# Patient Record
Sex: Female | Born: 1980
Health system: Southern US, Community
[De-identification: ages and names within clinical notes are randomized; demographics above are authoritative.]

## PROBLEM LIST (undated history)

## (undated) ENCOUNTER — Ambulatory Visit (HOSPITAL_COMMUNITY): Admission: EM | Payer: BC Managed Care – PPO | Source: Home / Self Care

## (undated) DIAGNOSIS — I10 Essential (primary) hypertension: Secondary | ICD-10-CM

## (undated) DIAGNOSIS — K219 Gastro-esophageal reflux disease without esophagitis: Secondary | ICD-10-CM

## (undated) DIAGNOSIS — D509 Iron deficiency anemia, unspecified: Secondary | ICD-10-CM

## (undated) DIAGNOSIS — J4530 Mild persistent asthma, uncomplicated: Secondary | ICD-10-CM

## (undated) DIAGNOSIS — Z8742 Personal history of other diseases of the female genital tract: Secondary | ICD-10-CM

## (undated) DIAGNOSIS — D252 Subserosal leiomyoma of uterus: Secondary | ICD-10-CM

## (undated) DIAGNOSIS — Z973 Presence of spectacles and contact lenses: Secondary | ICD-10-CM

## (undated) DIAGNOSIS — F419 Anxiety disorder, unspecified: Secondary | ICD-10-CM

## (undated) DIAGNOSIS — T7840XA Allergy, unspecified, initial encounter: Secondary | ICD-10-CM

## (undated) DIAGNOSIS — F32A Depression, unspecified: Secondary | ICD-10-CM

## (undated) DIAGNOSIS — J45909 Unspecified asthma, uncomplicated: Secondary | ICD-10-CM

## (undated) HISTORY — DX: Anxiety disorder, unspecified: F41.9

## (undated) HISTORY — DX: Depression, unspecified: F32.A

## (undated) HISTORY — DX: Iron deficiency anemia, unspecified: D50.9

## (undated) HISTORY — DX: Allergy, unspecified, initial encounter: T78.40XA

## (undated) HISTORY — DX: Gastro-esophageal reflux disease without esophagitis: K21.9

## (undated) HISTORY — DX: Essential (primary) hypertension: I10

## (undated) HISTORY — PX: HERNIA REPAIR: SHX51

---
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
## 2001-07-20 ENCOUNTER — Emergency Department (HOSPITAL_COMMUNITY): Admission: EM | Admit: 2001-07-20 | Discharge: 2001-07-21 | Payer: Self-pay | Admitting: *Deleted

## 2002-06-24 ENCOUNTER — Emergency Department (HOSPITAL_COMMUNITY): Admission: EM | Admit: 2002-06-24 | Discharge: 2002-06-24 | Payer: Self-pay | Admitting: *Deleted

## 2002-07-19 ENCOUNTER — Emergency Department (HOSPITAL_COMMUNITY): Admission: EM | Admit: 2002-07-19 | Discharge: 2002-07-19 | Payer: Self-pay | Admitting: Emergency Medicine

## 2003-10-03 ENCOUNTER — Emergency Department (HOSPITAL_COMMUNITY): Admission: EM | Admit: 2003-10-03 | Discharge: 2003-10-03 | Payer: Self-pay | Admitting: Emergency Medicine

## 2003-11-12 ENCOUNTER — Emergency Department (HOSPITAL_COMMUNITY): Admission: EM | Admit: 2003-11-12 | Discharge: 2003-11-12 | Payer: Self-pay | Admitting: Emergency Medicine

## 2004-08-11 ENCOUNTER — Emergency Department (HOSPITAL_COMMUNITY): Admission: EM | Admit: 2004-08-11 | Discharge: 2004-08-11 | Payer: Self-pay | Admitting: Emergency Medicine

## 2004-09-30 ENCOUNTER — Encounter: Admission: RE | Admit: 2004-09-30 | Discharge: 2004-10-15 | Payer: Self-pay | Admitting: Nurse Practitioner

## 2005-01-05 ENCOUNTER — Emergency Department (HOSPITAL_COMMUNITY): Admission: EM | Admit: 2005-01-05 | Discharge: 2005-01-05 | Payer: Self-pay | Admitting: Emergency Medicine

## 2005-09-13 ENCOUNTER — Emergency Department (HOSPITAL_COMMUNITY): Admission: EM | Admit: 2005-09-13 | Discharge: 2005-09-14 | Payer: Self-pay | Admitting: Emergency Medicine

## 2005-09-14 ENCOUNTER — Emergency Department (HOSPITAL_COMMUNITY): Admission: EM | Admit: 2005-09-14 | Discharge: 2005-09-14 | Payer: Self-pay | Admitting: Emergency Medicine

## 2006-11-07 ENCOUNTER — Emergency Department (HOSPITAL_COMMUNITY): Admission: EM | Admit: 2006-11-07 | Discharge: 2006-11-07 | Payer: Self-pay | Admitting: Emergency Medicine

## 2006-12-13 ENCOUNTER — Emergency Department (HOSPITAL_COMMUNITY): Admission: EM | Admit: 2006-12-13 | Discharge: 2006-12-13 | Payer: Self-pay | Admitting: Emergency Medicine

## 2008-07-20 ENCOUNTER — Emergency Department (HOSPITAL_COMMUNITY): Admission: EM | Admit: 2008-07-20 | Discharge: 2008-07-20 | Payer: Self-pay | Admitting: Emergency Medicine

## 2010-12-08 LAB — URINE MICROSCOPIC-ADD ON

## 2010-12-08 LAB — URINALYSIS, ROUTINE W REFLEX MICROSCOPIC
Glucose, UA: NEGATIVE
Ketones, ur: NEGATIVE
Protein, ur: NEGATIVE

## 2010-12-08 LAB — URINE CULTURE

## 2010-12-08 LAB — POCT PREGNANCY, URINE
Operator id: 146091
Preg Test, Ur: NEGATIVE

## 2012-11-08 ENCOUNTER — Emergency Department (HOSPITAL_COMMUNITY)
Admission: EM | Admit: 2012-11-08 | Discharge: 2012-11-08 | Disposition: A | Discharge status: Home / Self Care | Payer: Self-pay | Attending: Emergency Medicine | Admitting: Emergency Medicine

## 2012-11-08 ENCOUNTER — Emergency Department (HOSPITAL_COMMUNITY): Payer: Self-pay

## 2012-11-08 ENCOUNTER — Encounter (HOSPITAL_COMMUNITY): Payer: Self-pay | Admitting: Emergency Medicine

## 2012-11-08 DIAGNOSIS — R0781 Pleurodynia: Secondary | ICD-10-CM

## 2012-11-08 DIAGNOSIS — Y9389 Activity, other specified: Secondary | ICD-10-CM | POA: Insufficient documentation

## 2012-11-08 DIAGNOSIS — J45909 Unspecified asthma, uncomplicated: Secondary | ICD-10-CM | POA: Insufficient documentation

## 2012-11-08 DIAGNOSIS — M542 Cervicalgia: Secondary | ICD-10-CM

## 2012-11-08 DIAGNOSIS — Y92410 Unspecified street and highway as the place of occurrence of the external cause: Secondary | ICD-10-CM | POA: Insufficient documentation

## 2012-11-08 DIAGNOSIS — S298XXA Other specified injuries of thorax, initial encounter: Secondary | ICD-10-CM | POA: Insufficient documentation

## 2012-11-08 DIAGNOSIS — Z3202 Encounter for pregnancy test, result negative: Secondary | ICD-10-CM | POA: Insufficient documentation

## 2012-11-08 HISTORY — DX: Unspecified asthma, uncomplicated: J45.909

## 2012-11-08 MED ORDER — CYCLOBENZAPRINE HCL 10 MG PO TABS: 10.0000 mg | ORAL_TABLET | Freq: Two times a day (BID) | ORAL | Status: DC | PRN

## 2012-11-08 MED ORDER — ACETAMINOPHEN 500 MG PO TABS
1000.0000 mg | ORAL_TABLET | Freq: Once | ORAL | Status: AC
  Administered 2012-11-08: 1000 mg via ORAL
  Filled 2012-11-08: qty 2

## 2012-11-08 MED ORDER — IBUPROFEN 600 MG PO TABS: 600.0000 mg | ORAL_TABLET | Freq: Four times a day (QID) | ORAL | Status: DC | PRN

## 2012-11-08 NOTE — ED Notes (Addendum)
Pt undressed, in gown, on continuous pulse oximetry and blood pressure cuff; family at bedside 
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
## 2012-12-17 ENCOUNTER — Encounter: Payer: Self-pay | Admitting: Obstetrics and Gynecology

## 2012-12-17 ENCOUNTER — Ambulatory Visit (INDEPENDENT_AMBULATORY_CARE_PROVIDER_SITE_OTHER): Payer: Self-pay | Admitting: Obstetrics and Gynecology

## 2012-12-17 VITALS — BP 134/95 | HR 87 | Ht 63.0 in | Wt 127.3 lb

## 2012-12-17 DIAGNOSIS — Z113 Encounter for screening for infections with a predominantly sexual mode of transmission: Secondary | ICD-10-CM

## 2012-12-17 DIAGNOSIS — R1031 Right lower quadrant pain: Secondary | ICD-10-CM

## 2012-12-17 NOTE — Addendum Note (Signed)
Addended by: Franchot Mimes on: 12/17/2012 03:59 PM   Modules accepted: Orders

## 2012-12-17 NOTE — Progress Notes (Signed)
Patient reports right sided pelvic pain for the past 4-5 months. Also reports painful sexual intercourse, states it feels like her cervix hurts.

## 2012-12-17 NOTE — Progress Notes (Signed)
  Subjective:    Patient ID: Erika Frazier, female    DOB: 1980-10-11, 32 y.o.   MRN: [id number]  HPI 32 yo G0P0 presenting today for evaluation of RLQ pain. Patient states this pain is similar to the pain she experienced in 2007 when she was diagnosed with a right ovarian cyst. Patient was started on OCP and the pain resolved until 3 months ago. Patient self discontinued OCP 1 month ago and is not using contraception. She would welcome a pregnancy if it were to occur. Patient describes the pain as Kiwana Deblasi, throbbing, and non radiating. She denies any other associated symptoms. She denies any aggravating factors, Ibuprofen helps.   Past Medical History  Diagnosis Date  . Asthma    No past surgical history on file. No family history on file. History  Substance Use Topics  . Smoking status: Never Smoker   . Smokeless tobacco: Not on file  . Alcohol Use: Yes      Review of Systems  All other systems reviewed and are negative.       Objective:   Physical Exam  GENERAL: Well-developed, well-nourished female in no acute distress.  ABDOMEN: Soft, nontender, nondistended.  PELVIC: Normal external female genitalia. Vagina is pink and rugated. Thick white discharge. Normal appearing cervix. Uterus is normal in size. No adnexal mass or tenderness. EXTREMITIES: No cyanosis, clubbing, or edema, 2+ distal pulses.     Assessment & Plan:  32 yo G0 with RLQ pain - wet prep collected - Patient requested full STD screening (GC/Chl, Hep B, Hep C, HIV and RPR) - Pelvic ultrasound ordered - Patient will be contacted with any abnormal results - Upon further consideration, patient states that she will resume OCPs if ultrasound is normal - RTC for annual exam

## 2012-12-18 ENCOUNTER — Telehealth: Payer: Self-pay | Admitting: *Deleted

## 2012-12-18 ENCOUNTER — Other Ambulatory Visit: Payer: Self-pay | Admitting: Obstetrics and Gynecology

## 2012-12-18 LAB — RPR

## 2012-12-18 LAB — WET PREP, GENITAL: Yeast Wet Prep HPF POC: NONE SEEN

## 2012-12-18 LAB — HEPATITIS C ANTIBODY: HCV Ab: NEGATIVE

## 2012-12-18 LAB — GC/CHLAMYDIA PROBE AMP: CT Probe RNA: NEGATIVE

## 2012-12-18 LAB — HEPATITIS B SURFACE ANTIGEN: Hepatitis B Surface Ag: NEGATIVE

## 2012-12-18 MED ORDER — METRONIDAZOLE 500 MG PO TABS: 500.0000 mg | ORAL_TABLET | Freq: Two times a day (BID) | ORAL | Status: DC

## 2012-12-18 NOTE — Telephone Encounter (Addendum)
Message copied by Jill Side on Tue Dec 18, 2012 11:03 AM ------      Message from: Catalina Antigua      Created: Tue Dec 18, 2012 10:48 AM       Please inform patient of positive BV. Rx flagyl e-prescribed            Thanks            Peggy ------  Called pt and left message on her personal voice mail regarding test results and medication treatment ordered. She may call back if she has additional questions.

## 2012-12-20 ENCOUNTER — Ambulatory Visit (HOSPITAL_COMMUNITY)
Admission: RE | Admit: 2012-12-20 | Discharge: 2012-12-20 | Disposition: A | Discharge status: Home / Self Care | Payer: Self-pay | Source: Ambulatory Visit | Attending: Obstetrics and Gynecology | Admitting: Obstetrics and Gynecology

## 2012-12-20 DIAGNOSIS — D259 Leiomyoma of uterus, unspecified: Secondary | ICD-10-CM | POA: Insufficient documentation

## 2012-12-20 DIAGNOSIS — N83209 Unspecified ovarian cyst, unspecified side: Secondary | ICD-10-CM | POA: Insufficient documentation

## 2012-12-20 DIAGNOSIS — Z113 Encounter for screening for infections with a predominantly sexual mode of transmission: Secondary | ICD-10-CM

## 2012-12-20 DIAGNOSIS — R1031 Right lower quadrant pain: Secondary | ICD-10-CM | POA: Insufficient documentation

## 2012-12-24 ENCOUNTER — Telehealth: Payer: Self-pay | Admitting: General Practice

## 2012-12-24 DIAGNOSIS — N83209 Unspecified ovarian cyst, unspecified side: Secondary | ICD-10-CM

## 2012-12-24 NOTE — Telephone Encounter (Signed)
Patient called and left message stating she would like her test results and ultrasound results. Called patient, no answer- left message stating we are returning your phone call, please call us back at the clinics and let us know if we can leave detailed information on your voicemail.

## 2012-12-24 NOTE — Telephone Encounter (Signed)
Message copied by Mannie Stabile on Mon Dec 24, 2012  4:46 PM ------      Message from: Montebello, Gigi Gin      Created: Fri Dec 21, 2012 12:05 PM       Please inform patient of normal ultrasound with the presence of a 3 cm cyst on her right ovary. She will be scheduled for a repeat ultrasound in December to confirm resolution of this cyst. In the mean time she should restart birth control RX. Please schedule repeat ultrasound for mid December ------

## 2012-12-26 NOTE — Telephone Encounter (Signed)
Ultrasound appt made for 12/17 at 8:30. Called patient and informed her of ultrasound results and need for follow up ultrasound in December to make sure the cyst has resolved and notified the patient of 12/17 appt. Patient verbalized understanding. Also informed patient that her doctor recommend she restart her birth control. Patient verbalized understanding and had no further questions

## 2012-12-26 NOTE — Telephone Encounter (Signed)
Pt left new message today @ 1124 stating that she is returning a call and it is ok to leave a message of her results.

## 2013-02-13 ENCOUNTER — Ambulatory Visit (HOSPITAL_COMMUNITY): Payer: Self-pay | Attending: Obstetrics and Gynecology

## 2013-05-13 DIAGNOSIS — D509 Iron deficiency anemia, unspecified: Secondary | ICD-10-CM | POA: Insufficient documentation

## 2014-03-04 ENCOUNTER — Ambulatory Visit: Payer: Self-pay | Admitting: Physical Therapy

## 2014-03-17 ENCOUNTER — Ambulatory Visit: Payer: Self-pay | Attending: Obstetrics and Gynecology | Admitting: Physical Therapy

## 2018-10-03 DIAGNOSIS — H6121 Impacted cerumen, right ear: Secondary | ICD-10-CM | POA: Diagnosis not present

## 2018-10-03 DIAGNOSIS — Z113 Encounter for screening for infections with a predominantly sexual mode of transmission: Secondary | ICD-10-CM | POA: Diagnosis not present

## 2018-10-03 DIAGNOSIS — Z0000 Encounter for general adult medical examination without abnormal findings: Secondary | ICD-10-CM | POA: Diagnosis not present

## 2018-10-03 DIAGNOSIS — Z87891 Personal history of nicotine dependence: Secondary | ICD-10-CM | POA: Diagnosis not present

## 2018-10-03 DIAGNOSIS — D509 Iron deficiency anemia, unspecified: Secondary | ICD-10-CM | POA: Diagnosis not present

## 2019-02-15 DIAGNOSIS — R399 Unspecified symptoms and signs involving the genitourinary system: Secondary | ICD-10-CM | POA: Diagnosis not present

## 2019-02-16 DIAGNOSIS — Z20828 Contact with and (suspected) exposure to other viral communicable diseases: Secondary | ICD-10-CM | POA: Diagnosis not present

## 2019-03-08 DIAGNOSIS — Z20828 Contact with and (suspected) exposure to other viral communicable diseases: Secondary | ICD-10-CM | POA: Diagnosis not present

## 2019-03-26 DIAGNOSIS — J4530 Mild persistent asthma, uncomplicated: Secondary | ICD-10-CM | POA: Insufficient documentation

## 2019-03-26 DIAGNOSIS — N760 Acute vaginitis: Secondary | ICD-10-CM | POA: Diagnosis not present

## 2019-03-26 DIAGNOSIS — R3129 Other microscopic hematuria: Secondary | ICD-10-CM | POA: Diagnosis not present

## 2019-04-01 DIAGNOSIS — R3129 Other microscopic hematuria: Secondary | ICD-10-CM | POA: Diagnosis not present

## 2019-06-18 DIAGNOSIS — R3129 Other microscopic hematuria: Secondary | ICD-10-CM | POA: Diagnosis not present

## 2019-10-04 DIAGNOSIS — Z13220 Encounter for screening for lipoid disorders: Secondary | ICD-10-CM | POA: Diagnosis not present

## 2019-10-04 DIAGNOSIS — F4321 Adjustment disorder with depressed mood: Secondary | ICD-10-CM | POA: Insufficient documentation

## 2019-10-04 DIAGNOSIS — R102 Pelvic and perineal pain: Secondary | ICD-10-CM | POA: Diagnosis not present

## 2019-10-04 DIAGNOSIS — R630 Anorexia: Secondary | ICD-10-CM | POA: Diagnosis not present

## 2019-10-04 DIAGNOSIS — Z1329 Encounter for screening for other suspected endocrine disorder: Secondary | ICD-10-CM | POA: Diagnosis not present

## 2019-10-04 DIAGNOSIS — Z0000 Encounter for general adult medical examination without abnormal findings: Secondary | ICD-10-CM | POA: Diagnosis not present

## 2019-11-15 DIAGNOSIS — Z20828 Contact with and (suspected) exposure to other viral communicable diseases: Secondary | ICD-10-CM | POA: Diagnosis not present

## 2019-12-19 DIAGNOSIS — D252 Subserosal leiomyoma of uterus: Secondary | ICD-10-CM | POA: Diagnosis not present

## 2019-12-19 DIAGNOSIS — R102 Pelvic and perineal pain: Secondary | ICD-10-CM | POA: Diagnosis not present

## 2019-12-20 DIAGNOSIS — D252 Subserosal leiomyoma of uterus: Secondary | ICD-10-CM | POA: Diagnosis not present

## 2020-01-07 DIAGNOSIS — K219 Gastro-esophageal reflux disease without esophagitis: Secondary | ICD-10-CM | POA: Diagnosis not present

## 2020-01-07 DIAGNOSIS — Z6823 Body mass index (BMI) 23.0-23.9, adult: Secondary | ICD-10-CM | POA: Diagnosis not present

## 2020-01-07 DIAGNOSIS — R1310 Dysphagia, unspecified: Secondary | ICD-10-CM | POA: Diagnosis not present

## 2020-01-07 DIAGNOSIS — R634 Abnormal weight loss: Secondary | ICD-10-CM | POA: Diagnosis not present

## 2020-01-07 DIAGNOSIS — D509 Iron deficiency anemia, unspecified: Secondary | ICD-10-CM | POA: Diagnosis not present

## 2020-01-07 DIAGNOSIS — D649 Anemia, unspecified: Secondary | ICD-10-CM | POA: Diagnosis not present

## 2020-01-07 HISTORY — PX: ESOPHAGOGASTRODUODENOSCOPY: SHX1529

## 2020-02-12 ENCOUNTER — Encounter: Payer: Self-pay | Admitting: Physician Assistant

## 2020-02-12 ENCOUNTER — Ambulatory Visit (INDEPENDENT_AMBULATORY_CARE_PROVIDER_SITE_OTHER): Payer: BC Managed Care – PPO | Admitting: Physician Assistant

## 2020-02-12 VITALS — BP 120/82 | HR 77 | Temp 97.8°F | Ht 62.25 in | Wt 136.0 lb

## 2020-02-12 DIAGNOSIS — F4321 Adjustment disorder with depressed mood: Secondary | ICD-10-CM

## 2020-02-12 DIAGNOSIS — D252 Subserosal leiomyoma of uterus: Secondary | ICD-10-CM | POA: Diagnosis not present

## 2020-02-12 DIAGNOSIS — K219 Gastro-esophageal reflux disease without esophagitis: Secondary | ICD-10-CM | POA: Diagnosis not present

## 2020-02-12 DIAGNOSIS — Z23 Encounter for immunization: Secondary | ICD-10-CM | POA: Diagnosis not present

## 2020-02-12 DIAGNOSIS — D509 Iron deficiency anemia, unspecified: Secondary | ICD-10-CM

## 2020-02-12 DIAGNOSIS — N946 Dysmenorrhea, unspecified: Secondary | ICD-10-CM

## 2020-02-12 NOTE — Patient Instructions (Signed)
It was great to see you!  Referral to gynecology and gastroenterology.  Update blood work today.  Follow-up as needed, or in late August 2022 for physical.  Take care,  Inda Coke PA-C

## 2020-02-12 NOTE — Progress Notes (Signed)
Erika Frazier is a 39 y.o. female is here to establish care.  I acted as a Education administrator for [employer], PA-C Anselmo Pickler, LPN   History of Present Illness:   Chief Complaint  Patient presents with  . Establish Care  . Gastroesophageal Reflux  . Fibroids    HPI   Pt is here to establish care today.  GERD Pt had EGD done on Nov 9th and was dx with GERD. She was having some dysphagia, intentional weight loss and anemia. Had her esophagus stretched. Pt was started on Protonix 40 mg daily. Tolerating medication. Pt is still having heartburn and throat sometimes feels like she still has some ongoing swallowing issues. Symptoms are overall improved however. She was also recommended to get a colonoscopy to evaluate her iron deficiency but she was reluctant to do this.  Fibroids Pt has been having painful periods worse the past 6 months. Pt does not have a GYN. Would like to see a gynecologist. Had a pelvic u/s in October 2021 which showed: Two subserosal uterine leiomyomata. She has never seen a gynecologist and would like to.  Macrocytic anemia Currently taking an every other day oral liquid iron supplement. She does not endorse heavy periods. Has history of fatigue with low iron, no prior transfusion. Denies rectal bleeding or personal/family history of colon cancer.  Depression/Insomnia Taking Remeron 15 mg daily. She feels well on this medication. Was on Wellbutrin but thinks that this may have been blunting her appetite. Denies SI/HI. Symptoms most significant after her mother passed away.   Health Maintenance Due  Topic Date Due  . TETANUS/TDAP  Never done  . PAP SMEAR-Modifier  Never done    Past Medical History:  Diagnosis Date  . GERD (gastroesophageal reflux disease)      Social History   Tobacco Use  . Smoking status: Light Tobacco Smoker    Packs/day: 0.00    Years: 1.00    Pack years: 0.00    Types: Cigars  . Smokeless tobacco: Never Used  Vaping Use   . Vaping Use: Never used  Substance Use Topics  . Alcohol use: Yes    Alcohol/week: 0.0 standard drinks  . Drug use: Yes    Types: Marijuana    Comment: weekends    History reviewed. No pertinent surgical history.  Family History  Problem Relation Age of Onset  . Hypertension Mother   . Alcohol abuse Father        now in remission  . Lupus Maternal Aunt   . Heart disease Maternal Aunt     PMHx, SurgHx, SocialHx, FamHx, Medications, and Allergies were reviewed in the Visit Navigator and updated as appropriate.   Patient Active Problem List   Diagnosis Date Noted  . Fibroids, subserous 02/12/2020  . Adjustment disorder with depressed mood 10/04/2019  . Mild persistent asthma without complication 33/38/3291  . Iron deficiency anemia 05/13/2013    Social History   Tobacco Use  . Smoking status: Light Tobacco Smoker    Packs/day: 0.00    Years: 1.00    Pack years: 0.00    Types: Cigars  . Smokeless tobacco: Never Used  Vaping Use  . Vaping Use: Never used  Substance Use Topics  . Alcohol use: Yes    Alcohol/week: 0.0 standard drinks  . Drug use: Yes    Types: Marijuana    Comment: weekends    Current Medications and Allergies:    Current Outpatient Medications:  .  albuterol (VENTOLIN HFA)  108 (90 Base) MCG/ACT inhaler, Inhale 2 puffs into the lungs every 6 (six) hours as needed., Disp: , Rfl:  .  Ferrous Fumarate (IRON) 18 MG TBCR, Take 18 mg by mouth every other day., Disp: , Rfl:  .  mirtazapine (REMERON) 15 MG tablet, Take by mouth., Disp: , Rfl:  .  pantoprazole (PROTONIX) 40 MG tablet, Take 40 mg by mouth daily., Disp: , Rfl:   No Known Allergies  Review of Systems   ROS  Negative unless otherwise specified per HPI.  Vitals:   Vitals:   02/12/20 1315  BP: 120/82  Pulse: 77  Temp: 97.8 F (36.6 C)  TempSrc: Temporal  SpO2: 99%  Weight: 136 lb (61.7 kg)  Height: 5' 2.25" (1.581 m)     Body mass index is 24.68 kg/m.   Physical Exam:     Physical Exam Vitals and nursing note reviewed.  Constitutional:      General: She is not in acute distress.    Appearance: She is well-developed. She is not ill-appearing, toxic-appearing or sickly-appearing.  Cardiovascular:     Rate and Rhythm: Normal rate and regular rhythm.     Pulses: Normal pulses.     Heart sounds: Normal heart sounds, S1 normal and S2 normal.     Comments: No LE edema Pulmonary:     Effort: Pulmonary effort is normal.     Breath sounds: Normal breath sounds.  Skin:    General: Skin is warm, dry and intact.  Neurological:     Mental Status: She is alert.     GCS: GCS eye subscore is 4. GCS verbal subscore is 5. GCS motor subscore is 6.  Psychiatric:        Mood and Affect: Mood and affect normal.        Speech: Speech normal.        Behavior: Behavior normal. Behavior is cooperative.      Assessment and Plan:    Deepti was seen today for establish care, gastroesophageal reflux and fibroids.  Diagnoses and all orders for this visit:  Iron deficiency anemia, unspecified iron deficiency anemia type Update blood work today to determine degree of anemia. Encouraged colonoscopy; she would like to start with new GI group; referral placed. Will advise on supplementation based on results. -     CBC with Differential/Platelet; Future -     Comprehensive metabolic panel; Future -     Ferritin; Future -     Iron; Future -     B12 and Folate Panel; Future -     Iron -     B12 and Folate Panel -     Ferritin -     Comprehensive metabolic panel -     CBC with Differential/Platelet -     Ambulatory referral to Gastroenterology  Gastroesophageal reflux disease, unspecified whether esophagitis present Overall improved, continue protonix 40 mg daily. -     Ambulatory referral to Gastroenterology  Fibroids, subserous; Dysmenorrhea No red flags; referral to ob-gyn. -     Ambulatory referral to Obstetrics / Gynecology  Adjustment disorder with  depressed mood Well controlled. Continue remeron 15 mg daily. Follow-up as needed.  Need for immunization against influenza -     Flu Vaccine QUAD 36+ mos IM  CMA or LPN served as scribe during this visit. History, Physical, and Plan performed by medical provider. The above documentation has been reviewed and is accurate and complete.  Inda Coke, PA-C [place], [facility] 02/12/2020  Follow-up: No follow-ups on file.

## 2020-02-13 LAB — CBC WITH DIFFERENTIAL/PLATELET
Absolute Monocytes: 389 cells/uL (ref 200–950)
Basophils Absolute: 32 cells/uL (ref 0–200)
Basophils Relative: 0.6 %
Eosinophils Absolute: 130 cells/uL (ref 15–500)
Eosinophils Relative: 2.4 %
HCT: 35.3 % (ref 35.0–45.0)
Hemoglobin: 10.9 g/dL — ABNORMAL LOW (ref 11.7–15.5)
Lymphs Abs: 1723 cells/uL (ref 850–3900)
MCH: 23.6 pg — ABNORMAL LOW (ref 27.0–33.0)
MCHC: 30.9 g/dL — ABNORMAL LOW (ref 32.0–36.0)
MCV: 76.6 fL — ABNORMAL LOW (ref 80.0–100.0)
MPV: 9.8 fL (ref 7.5–12.5)
Monocytes Relative: 7.2 %
Neutro Abs: 3127 cells/uL (ref 1500–7800)
Neutrophils Relative %: 57.9 %
Platelets: 409 10*3/uL — ABNORMAL HIGH (ref 140–400)
RBC: 4.61 10*6/uL (ref 3.80–5.10)
RDW: 14.1 % (ref 11.0–15.0)
Total Lymphocyte: 31.9 %
WBC: 5.4 10*3/uL (ref 3.8–10.8)

## 2020-02-13 LAB — COMPREHENSIVE METABOLIC PANEL
AG Ratio: 2.1 (calc) (ref 1.0–2.5)
ALT: 10 U/L (ref 6–29)
AST: 14 U/L (ref 10–30)
Albumin: 4.4 g/dL (ref 3.6–5.1)
Alkaline phosphatase (APISO): 58 U/L (ref 31–125)
BUN: 17 mg/dL (ref 7–25)
CO2: 27 mmol/L (ref 20–32)
Calcium: 9.7 mg/dL (ref 8.6–10.2)
Chloride: 104 mmol/L (ref 98–110)
Creat: 0.8 mg/dL (ref 0.50–1.10)
Globulin: 2.1 g/dL (calc) (ref 1.9–3.7)
Glucose, Bld: 84 mg/dL (ref 65–99)
Potassium: 4.5 mmol/L (ref 3.5–5.3)
Sodium: 138 mmol/L (ref 135–146)
Total Bilirubin: 0.4 mg/dL (ref 0.2–1.2)
Total Protein: 6.5 g/dL (ref 6.1–8.1)

## 2020-02-13 LAB — B12 AND FOLATE PANEL
Folate: 12.4 ng/mL
Vitamin B-12: 409 pg/mL (ref 200–1100)

## 2020-02-13 LAB — FERRITIN: Ferritin: 26 ng/mL (ref 16–154)

## 2020-02-13 LAB — IRON: Iron: 85 ug/dL (ref 40–190)

## 2020-02-27 ENCOUNTER — Encounter: Payer: Self-pay | Admitting: Physician Assistant

## 2020-02-27 DIAGNOSIS — L709 Acne, unspecified: Secondary | ICD-10-CM

## 2020-03-18 ENCOUNTER — Encounter: Payer: Self-pay | Admitting: Obstetrics & Gynecology

## 2020-03-18 ENCOUNTER — Other Ambulatory Visit (HOSPITAL_COMMUNITY)
Admission: RE | Admit: 2020-03-18 | Discharge: 2020-03-18 | Disposition: A | Discharge status: Home / Self Care | Payer: BC Managed Care – PPO | Source: Ambulatory Visit | Attending: Obstetrics & Gynecology | Admitting: Obstetrics & Gynecology

## 2020-03-18 ENCOUNTER — Other Ambulatory Visit: Payer: Self-pay

## 2020-03-18 ENCOUNTER — Ambulatory Visit (INDEPENDENT_AMBULATORY_CARE_PROVIDER_SITE_OTHER): Payer: BC Managed Care – PPO | Admitting: Obstetrics & Gynecology

## 2020-03-18 VITALS — BP 146/91 | HR 71 | Wt 140.0 lb

## 2020-03-18 DIAGNOSIS — R102 Pelvic and perineal pain: Secondary | ICD-10-CM | POA: Diagnosis not present

## 2020-03-18 DIAGNOSIS — Z113 Encounter for screening for infections with a predominantly sexual mode of transmission: Secondary | ICD-10-CM | POA: Diagnosis not present

## 2020-03-18 DIAGNOSIS — Z124 Encounter for screening for malignant neoplasm of cervix: Secondary | ICD-10-CM | POA: Insufficient documentation

## 2020-03-18 DIAGNOSIS — D252 Subserosal leiomyoma of uterus: Secondary | ICD-10-CM

## 2020-03-18 DIAGNOSIS — D509 Iron deficiency anemia, unspecified: Secondary | ICD-10-CM | POA: Diagnosis not present

## 2020-03-18 NOTE — Progress Notes (Signed)
40 y.o. Erika Frazier here for complaint of pelvic pain and pain with her menstrual cycles.  Cycles are regular.  Flow lasts about 5 days.  Uses regular tampons with mini-pad and changes every 4-6 hours.  She does have a hx of iron deficiency and she takes liquid iron.  She's never had an iron infusion.  Had endoscopy 01/07/2020 with esophageal stretching with Dr. Rolm Bookbinder with [facility].  Had ultrasound with [place] [facility] facility on 12/20/2019.  Findings showed:  8.6 x 1.8 x 5.1 cm = volume: 41 mL. Large fibroid at posterior upper uterus 5.1 x 3.9 x 3.9 cm. Smaller leiomyoma at fundus 3.5 x 2.3 x 3.6 cm. Both of these are subserosal. No additional uterine masses.  Endometrium thickness: 5 mm. No endometrial fluid or focal abnormality.  Ovaries normal.  No free fluid.  Pt feels like her pain has gradually worsened over the past few years.  She did not know she had fibroids.  Pain for her is present around mid cycle and then again right before cycle and on her cycle.  She would really like this to improve.  Patient's last menstrual period was 03/10/2020.          Sexually active: Yes.    The current method of family planning is condoms  Smoker:  no  Health Maintenance: Pap:  Not recently History of abnormal Pap:  no MMG:  Guidelines reviewed Colonoscopy:  Planning due to anemia   reports that she has quit smoking. Her smoking use included cigars. She smoked 0.00 packs per day for 1.00 year. She has never used smokeless tobacco. She reports current alcohol use. She reports current drug use. Drug: Marijuana.  Past Medical History:  Diagnosis Date  . GERD (gastroesophageal reflux disease)     History reviewed. No pertinent surgical history.  Current Outpatient Medications  Medication Sig Dispense Refill  . albuterol (VENTOLIN HFA) 108 (90 Base) MCG/ACT inhaler Inhale 2 puffs into the lungs every 6 (six) hours as needed.    . Ferrous Fumarate (IRON) 18 MG TBCR Take 18 mg  by mouth every other day.    . mirtazapine (REMERON) 15 MG tablet Take by mouth.    . pantoprazole (PROTONIX) 40 MG tablet Take 40 mg by mouth daily.     No current facility-administered medications for this visit.    Family History  Problem Relation Age of Onset  . Hypertension Mother   . Alcohol abuse Father        now in remission  . Lupus Maternal Aunt   . Heart disease Maternal Aunt     Review of Systems  All other systems reviewed and are negative.   Exam:   BP (!) 146/91   Pulse 71   Wt 140 lb (63.5 kg)   LMP 03/10/2020   BMI 25.40 kg/m      General appearance: alert, cooperative and appears stated age Head: Normocephalic, without obvious abnormality, atraumatic Neck: no adenopathy, supple, symmetrical, trachea midline and thyroid normal to inspection and palpation Lungs: clear to auscultation bilaterally Breasts: normal appearance, no masses or tenderness Heart: regular rate and rhythm Abdomen: soft, non-tender; bowel sounds normal; no masses,  no organomegaly Extremities: extremities normal, atraumatic, no cyanosis or edema Skin: Skin color, texture, turgor normal. No rashes or lesions Lymph nodes: Cervical, supraclavicular, and axillary nodes normal. No abnormal inguinal nodes palpated Neurologic: Grossly normal   Pelvic: External genitalia:  no lesions  Urethra:  normal appearing urethra with no masses, tenderness or lesions              Bartholins and Skenes: normal                 Vagina: normal appearing vagina with normal color and discharge, no lesions              Cervix: no lesions              Pap taken: Yes.   Bimanual Exam:  Uterus:  enlarged, 10 weeks size, mobile              Adnexa: normal adnexa and no mass, fullness, tenderness              Anus:  No lesions  Chaperone, Kathrene Alu, RN, was present for exam.  Assessment/Plan: 1. Fibroids, subserous - treatment options discussed including [place], RFA treatment, myomectomy,  hysterectomy.  Pt would like to keep uterus.  Is doubtful of ability to get pregnant but doesn't want to fully eliminate this.  We discussed that [place] and, of course hysterectomy are not possible with future desires for pregnancy.  Also discussed that after RFA treatment, pregnancy not currently recommended but this is being investigated at this time.  She is most interested in RFA treatment.  [place] website shown to pt for additional information.  We also discussed considering laparoscopy at the same time for evaluation for endometriosis.  Possible findings and treatment discussed.  2. Pelvic pain - see above  3. Iron deficiency anemia, unspecified iron deficiency anemia type - followed by PCP  4. Cervical cancer screening - Cytology - PAP( )  5. Screening examination for STD (sexually transmitted disease) - GC/CHl/trich obtained with pap smear - RPR+HBsAg+HIV - Hepatitis C antibody  39 minutes of total time was spent for this patient encounter, including preparation, face-to-face counseling with the patient and coordination of care, and documentation of the encounter.'

## 2020-03-19 ENCOUNTER — Encounter: Payer: Self-pay | Admitting: Obstetrics & Gynecology

## 2020-03-19 LAB — RPR+HBSAG+HIV
HIV Screen 4th Generation wRfx: NONREACTIVE
Hepatitis B Surface Ag: NEGATIVE
RPR Ser Ql: NONREACTIVE

## 2020-03-19 LAB — CYTOLOGY - PAP
Chlamydia: NEGATIVE
Comment: NEGATIVE
Comment: NEGATIVE
Comment: NEGATIVE
Comment: NORMAL
Diagnosis: NEGATIVE
High risk HPV: NEGATIVE
Neisseria Gonorrhea: NEGATIVE
Trichomonas: NEGATIVE

## 2020-03-19 LAB — HEPATITIS C ANTIBODY: Hep C Virus Ab: <0.1 s/co ratio (ref 0.0–0.9)

## 2020-03-27 ENCOUNTER — Other Ambulatory Visit (HOSPITAL_COMMUNITY)
Admission: RE | Admit: 2020-03-27 | Discharge: 2020-03-27 | Disposition: A | Discharge status: Home / Self Care | Payer: BC Managed Care – PPO | Source: Ambulatory Visit | Attending: Family Medicine | Admitting: Family Medicine

## 2020-03-27 ENCOUNTER — Other Ambulatory Visit: Payer: Self-pay

## 2020-03-27 ENCOUNTER — Ambulatory Visit: Payer: BC Managed Care – PPO

## 2020-03-27 VITALS — BP 119/79 | HR 77

## 2020-03-27 DIAGNOSIS — N898 Other specified noninflammatory disorders of vagina: Secondary | ICD-10-CM | POA: Insufficient documentation

## 2020-03-27 NOTE — Progress Notes (Signed)
Patient states she has had vaginal discharge for a few days. Patient has not used any over the counter treatments. Patient describes a white discharge with no itching/burning. Patient denies any new sexual partners and would like STD screening done on todays culture.  Kathrene Alu RN

## 2020-03-30 LAB — CERVICOVAGINAL ANCILLARY ONLY
Bacterial Vaginitis (gardnerella): POSITIVE — AB
Candida Glabrata: NEGATIVE
Candida Vaginitis: NEGATIVE
Chlamydia: NEGATIVE
Comment: NEGATIVE
Comment: NEGATIVE
Comment: NEGATIVE
Comment: NEGATIVE
Comment: NEGATIVE
Comment: NORMAL
Neisseria Gonorrhea: NEGATIVE
Trichomonas: NEGATIVE

## 2020-03-31 ENCOUNTER — Encounter: Payer: Self-pay | Admitting: Physician Assistant

## 2020-03-31 DIAGNOSIS — Z20822 Contact with and (suspected) exposure to covid-19: Secondary | ICD-10-CM | POA: Diagnosis not present

## 2020-03-31 MED ORDER — METRONIDAZOLE 500 MG PO TABS: 500.0000 mg | ORAL_TABLET | Freq: Two times a day (BID) | ORAL | 0 refills | Status: DC

## 2020-03-31 NOTE — Addendum Note (Signed)
Addended by: Truett Mainland on: 03/31/2020 01:03 PM   Modules accepted: Orders

## 2020-06-29 NOTE — Telephone Encounter (Signed)
Called patient and left message to call the office back to set up a office visit or my chart visit with  Dr.Miller .

## 2020-07-01 ENCOUNTER — Other Ambulatory Visit: Payer: Self-pay

## 2020-07-01 ENCOUNTER — Telehealth (INDEPENDENT_AMBULATORY_CARE_PROVIDER_SITE_OTHER): Payer: BC Managed Care – PPO | Admitting: Obstetrics & Gynecology

## 2020-07-01 DIAGNOSIS — D509 Iron deficiency anemia, unspecified: Secondary | ICD-10-CM | POA: Diagnosis not present

## 2020-07-01 DIAGNOSIS — D252 Subserosal leiomyoma of uterus: Secondary | ICD-10-CM

## 2020-07-01 DIAGNOSIS — D500 Iron deficiency anemia secondary to blood loss (chronic): Secondary | ICD-10-CM | POA: Diagnosis not present

## 2020-07-01 DIAGNOSIS — R102 Pelvic and perineal pain: Secondary | ICD-10-CM

## 2020-07-01 NOTE — Progress Notes (Signed)
Virtual Visit via Video Note  I connected with Erika Frazier on 07/01/20 at  4:15 PM EDT by a video enabled telemedicine application and verified that I am speaking with the correct person using two identifiers.  Location: Patient: home Provider: office   I discussed the limitations of evaluation and management by telemedicine and the availability of in person appointments. The patient expressed understanding and agreed to proceed.  History of Present Illness: 40 yo G0 S AAF with symptomatic uterine fibroids who is desirous of treatment.  Last ultrasound 12/20/2019 at [facility] and in [place] showing fibroids 5.1 x 3.9 x 3.9cm and 3.5 x 2.3 x 2.6cm.  These are both subserosal.  Has some symptoms of enlarged uterus including pelvic pain and pressure as well as dysmenorrhea.  She does have a hx of iron deficiency and takes liquid iron to help.  Most recent CBC, 02/12/2020 showing mild anemia with hb 10.9.  Is desirous of treatment.  I saw her in January and we discussed myomectomy vs RFA treatment.  Pt desires to maintain uterus.  She continues to hope she will be able to have a pregnancy.  Is dating someone but not actively trying for pregnancy.  Wonders if she can even get pregnant as she has not in the past.  Aware that at this time RFA not approved for women who desire future pregnancy but also aware there are patients who have undergone RFA and have carried successful pregnancy.  Uterine rupture with risks to baby and to her discussed.  RFA of fibroids is what she wants, esp from a recovery standpoint.  Aware will need to get this approved with insurance and that we will need to also have a pre-operative ultrasound to re visualize location of fibroids and size.  Procedure, hospital stay, pain management, recovery, return to work all discussed.   Observations/Objective: WNWD AAF, NAD  Assessment and Plan: 1. Fibroids, subserous - will proceed with prior authorization with insurance and with  surgical planning  2. Pelvic pain  3. Iron deficiency anemia, unspecified iron deficiency anemia type    I discussed the assessment and treatment plan with the patient. The patient was provided an opportunity to ask questions and all were answered. The patient agreed with the plan and demonstrated an understanding of the instructions.   The patient was advised to call back or seek an in-person evaluation if the symptoms worsen or if the condition fails to improve as anticipated.  I provided 24 minutes of non-face-to-face time during this encounter.   Megan Salon, MD

## 2020-07-07 ENCOUNTER — Encounter (HOSPITAL_BASED_OUTPATIENT_CLINIC_OR_DEPARTMENT_OTHER): Payer: Self-pay | Admitting: Obstetrics & Gynecology

## 2020-07-08 ENCOUNTER — Telehealth: Payer: Self-pay | Admitting: *Deleted

## 2020-07-08 ENCOUNTER — Encounter: Payer: Self-pay | Admitting: *Deleted

## 2020-07-08 NOTE — Telephone Encounter (Signed)
Return call from patient. Reviewed process for precert of [place]. Patient will come to [place] for [employer] after work today to review consents.  Request address be sent through [facility] Chart message.

## 2020-07-08 NOTE — Telephone Encounter (Signed)
Patient into office to review [place] forms. ROI completed by patient. Denies questions.

## 2020-07-08 NOTE — Telephone Encounter (Signed)
Call to patient to review procedure scheduling. Left message to call back to [phone].

## 2020-07-10 NOTE — Telephone Encounter (Signed)
Precert document sent to [employer] ( scanned into chart). Will receive update in about15 days.   Encounter closed.

## 2020-07-20 ENCOUNTER — Encounter: Payer: BC Managed Care – PPO | Admitting: Physician Assistant

## 2020-07-22 ENCOUNTER — Ambulatory Visit: Payer: BC Managed Care – PPO | Admitting: Dermatology

## 2020-08-04 ENCOUNTER — Ambulatory Visit (INDEPENDENT_AMBULATORY_CARE_PROVIDER_SITE_OTHER): Payer: BC Managed Care – PPO | Admitting: Family

## 2020-08-04 ENCOUNTER — Encounter: Payer: Self-pay | Admitting: Family

## 2020-08-04 ENCOUNTER — Other Ambulatory Visit: Payer: Self-pay

## 2020-08-04 VITALS — BP 127/87 | HR 72 | Temp 98.4°F | Ht 62.25 in | Wt 144.0 lb

## 2020-08-04 DIAGNOSIS — K219 Gastro-esophageal reflux disease without esophagitis: Secondary | ICD-10-CM | POA: Diagnosis not present

## 2020-08-04 DIAGNOSIS — Z0000 Encounter for general adult medical examination without abnormal findings: Secondary | ICD-10-CM | POA: Diagnosis not present

## 2020-08-04 NOTE — Progress Notes (Signed)
Established Patient Office Visit  Subjective:  Patient ID: Erika Frazier, female    DOB: Jul 21, 1980  Age: 40 y.o. MRN: [id number]  CC:  Chief Complaint  Patient presents with  . Annual Exam  . Asthma  . Iron deficiency  . Tingling    Pt complains of tingling in her thumb for a week.     HPI [employer] presents for complete physical exam.  Has concerns of tingling in her left thumb x1 to 2 weeks.  Patient reports that she had job injury in February that was a crush injury between 2 pieces of metal.  She underwent physical therapy and overall was better.  Does do repetitive motions at work, lifting.  Denies any pain patient also has a history of GERD and takes acid reducing medications that typically help.  She also tries to follow a GERD friendly diet and exercise.  She rides a bike 20 minutes about 3 times a week.  Also walks.  She sees gynecology for female well care and had that done about 6 months ago  Past Medical History:  Diagnosis Date  . GERD (gastroesophageal reflux disease)     History reviewed. No pertinent surgical history.  Family History  Problem Relation Age of Onset  . Hypertension Mother   . Alcohol abuse Father        now in remission  . Lupus Maternal Aunt   . Heart disease Maternal Aunt     Social History   Socioeconomic History  . Marital status: Single    Spouse name: Not on file  . Number of children: Not on file  . Years of education: Not on file  . Highest education level: Not on file  Occupational History  . Not on file  Tobacco Use  . Smoking status: Former Smoker    Packs/day: 0.00    Years: 1.00    Pack years: 0.00    Types: Cigars  . Smokeless tobacco: Never Used  Vaping Use  . Vaping Use: Never used  Substance and Sexual Activity  . Alcohol use: Yes    Alcohol/week: 0.0 standard drinks  . Drug use: Yes    Types: Marijuana    Comment: weekends  . Sexual activity: Yes    Birth control/protection: Condom    Comment:  Would like STD an HIV testing  Other Topics Concern  . Not on file  Social History Narrative   Lives with boyfriend   Social Determinants of Health   Financial Resource Strain: Not on file  Food Insecurity: Not on file  Transportation Needs: Not on file  Physical Activity: Not on file  Stress: Not on file  Social Connections: Not on file  Intimate Partner Violence: Not on file    Outpatient Medications Prior to Visit  Medication Sig Dispense Refill  . albuterol (VENTOLIN HFA) 108 (90 Base) MCG/ACT inhaler Inhale 2 puffs into the lungs every 6 (six) hours as needed.    . Ferrous Fumarate (IRON) 18 MG TBCR Take 18 mg by mouth every other day.    . fluticasone (FLOVENT HFA) 110 MCG/ACT inhaler Inhale 2 puffs into the lungs 2 (two) times daily.    . mirtazapine (REMERON) 15 MG tablet Take by mouth.    . pantoprazole (PROTONIX) 40 MG tablet Take 40 mg by mouth daily.    . metroNIDAZOLE (FLAGYL) 500 MG tablet Take 1 tablet (500 mg total) by mouth 2 (two) times daily. 14 tablet 0   No facility-administered medications prior  to visit.    No Known Allergies  ROS Review of Systems  Constitutional: Negative.   HENT: Negative.   Eyes: Negative.   Respiratory: Negative.   Cardiovascular: Negative.   Gastrointestinal: Negative.   Endocrine: Negative.   Genitourinary: Negative.   Musculoskeletal: Negative.   Skin: Negative.   Allergic/Immunologic: Negative.   Neurological: Negative for dizziness, weakness and light-headedness.       Tingling in the left thumb  Hematological: Negative.   Psychiatric/Behavioral: Negative.   All other systems reviewed and are negative.     Objective:    Physical Exam Vitals and nursing note reviewed.  Constitutional:      Appearance: Normal appearance.  HENT:     Head: Normocephalic and atraumatic.     Right Ear: Tympanic membrane and ear canal normal.     Left Ear: Tympanic membrane and ear canal normal.     Nose: Nose normal.      Mouth/Throat:     Mouth: Mucous membranes are moist.  Eyes:     Extraocular Movements: Extraocular movements intact.     Pupils: Pupils are equal, round, and reactive to light.  Cardiovascular:     Rate and Rhythm: Normal rate and regular rhythm.     Pulses: Normal pulses.     Heart sounds: Normal heart sounds.  Pulmonary:     Effort: Pulmonary effort is normal.     Breath sounds: Normal breath sounds.  Abdominal:     General: Abdomen is flat. Bowel sounds are normal.     Palpations: Abdomen is soft.  Musculoskeletal:        General: Normal range of motion.     Cervical back: Normal range of motion and neck supple.  Skin:    General: Skin is warm and dry.  Neurological:     General: No focal deficit present.     Mental Status: She is alert and oriented to person, place, and time.  Psychiatric:        Mood and Affect: Mood normal.        Behavior: Behavior normal.     BP 127/87   Pulse 72   Temp 98.4 F (36.9 C) (Temporal)   Ht 5' 2.25" (1.581 m)   Wt 144 lb (65.3 kg)   SpO2 100%   BMI 26.13 kg/m  Wt Readings from Last 3 Encounters:  08/04/20 144 lb (65.3 kg)  03/18/20 140 lb (63.5 kg)  02/12/20 136 lb (61.7 kg)     Health Maintenance Due  Topic Date Due  . Pneumococcal Vaccine 9-84 Years old (1 of 2 - PPSV23) Never done  . TETANUS/TDAP  Never done    There are no preventive care reminders to display for this patient.  No results found for: TSH Lab Results  Component Value Date   WBC 5.4 02/12/2020   HGB 10.9 (L) 02/12/2020   HCT 35.3 02/12/2020   MCV 76.6 (L) 02/12/2020   PLT 409 (H) 02/12/2020   Lab Results  Component Value Date   NA 138 02/12/2020   K 4.5 02/12/2020   CO2 27 02/12/2020   GLUCOSE 84 02/12/2020   BUN 17 02/12/2020   CREATININE 0.80 02/12/2020   BILITOT 0.4 02/12/2020   AST 14 02/12/2020   ALT 10 02/12/2020   PROT 6.5 02/12/2020   CALCIUM 9.7 02/12/2020   No results found for: CHOL No results found for: HDL No results found  for: LDLCALC No results found for: TRIG No results found for: CHOLHDL  No results found for: HGBA1C    Assessment & Plan:   Problem List Items Addressed This Visit   None   Visit Diagnoses    Routine general medical examination at a health care facility    -  Primary   Relevant Orders   Comprehensive metabolic panel   Lipid panel   Gastroesophageal reflux disease without esophagitis          No orders of the defined types were placed in this encounter.   Follow-up: Return in 1 year (on 08/04/2021).  Encouraged healthy diet, exercise, introduce alkaline water into the diet to help with reflux.  Ibuprofen or Advil as needed when the tingling of the thumb occurs.  If symptoms persist will consider referring for nerve conduction studies.   Kennyth Arnold, FNP

## 2020-08-04 NOTE — Patient Instructions (Signed)
Health Maintenance, Female Adopting a healthy lifestyle and getting preventive care are important in promoting health and wellness. Ask your health care provider about:  The right schedule for you to have regular tests and exams.  Things you can do on your own to prevent diseases and keep yourself healthy. What should I know about diet, weight, and exercise? Eat a healthy diet  Eat a diet that includes plenty of vegetables, fruits, low-fat dairy products, and lean protein.  Do not eat a lot of foods that are high in solid fats, added sugars, or sodium.   Maintain a healthy weight Body mass index (BMI) is used to identify weight problems. It estimates body fat based on height and weight. Your health care provider can help determine your BMI and help you achieve or maintain a healthy weight. Get regular exercise Get regular exercise. This is one of the most important things you can do for your health. Most adults should:  Exercise for at least 150 minutes each week. The exercise should increase your heart rate and make you sweat (moderate-intensity exercise).  Do strengthening exercises at least twice a week. This is in addition to the moderate-intensity exercise.  Spend less time sitting. Even light physical activity can be beneficial. Watch cholesterol and blood lipids Have your blood tested for lipids and cholesterol at 39 years of age, then have this test every 5 years. Have your cholesterol levels checked more often if:  Your lipid or cholesterol levels are high.  You are older than 40 years of age.  You are at high risk for heart disease. What should I know about cancer screening? Depending on your health history and family history, you may need to have cancer screening at various ages. This may include screening for:  Breast cancer.  Cervical cancer.  Colorectal cancer.  Skin cancer.  Lung cancer. What should I know about heart disease, diabetes, and high blood  pressure? Blood pressure and heart disease  High blood pressure causes heart disease and increases the risk of stroke. This is more likely to develop in people who have high blood pressure readings, are of African descent, or are overweight.  Have your blood pressure checked: ? Every 3-5 years if you are 18-39 years of age. ? Every year if you are 40 years old or older. Diabetes Have regular diabetes screenings. This checks your fasting blood sugar level. Have the screening done:  Once every three years after age 40 if you are at a normal weight and have a low risk for diabetes.  More often and at a younger age if you are overweight or have a high risk for diabetes. What should I know about preventing infection? Hepatitis B If you have a higher risk for hepatitis B, you should be screened for this virus. Talk with your health care provider to find out if you are at risk for hepatitis B infection. Hepatitis C Testing is recommended for:  Everyone born from 1945 through 1965.  Anyone with known risk factors for hepatitis C. Sexually transmitted infections (STIs)  Get screened for STIs, including gonorrhea and chlamydia, if: ? You are sexually active and are younger than 39 years of age. ? You are older than 39 years of age and your health care provider tells you that you are at risk for this type of infection. ? Your sexual activity has changed since you were last screened, and you are at increased risk for chlamydia or gonorrhea. Ask your health care provider   if you are at risk.  Ask your health care provider about whether you are at high risk for HIV. Your health care provider may recommend a prescription medicine to help prevent HIV infection. If you choose to take medicine to prevent HIV, you should first get tested for HIV. You should then be tested every 3 months for as long as you are taking the medicine. Pregnancy  If you are about to stop having your period (premenopausal) and  you may become pregnant, seek counseling before you get pregnant.  Take 400 to 800 micrograms (mcg) of folic acid every day if you become pregnant.  Ask for birth control (contraception) if you want to prevent pregnancy. Osteoporosis and menopause Osteoporosis is a disease in which the bones lose minerals and strength with aging. This can result in bone fractures. If you are 65 years old or older, or if you are at risk for osteoporosis and fractures, ask your health care provider if you should:  Be screened for bone loss.  Take a calcium or vitamin D supplement to lower your risk of fractures.  Be given hormone replacement therapy (HRT) to treat symptoms of menopause. Follow these instructions at home: Lifestyle  Do not use any products that contain nicotine or tobacco, such as cigarettes, e-cigarettes, and chewing tobacco. If you need help quitting, ask your health care provider.  Do not use street drugs.  Do not share needles.  Ask your health care provider for help if you need support or information about quitting drugs. Alcohol use  Do not drink alcohol if: ? Your health care provider tells you not to drink. ? You are pregnant, may be pregnant, or are planning to become pregnant.  If you drink alcohol: ? Limit how much you use to 0-1 drink a day. ? Limit intake if you are breastfeeding.  Be aware of how much alcohol is in your drink. In the [place] one drink equals one 12 oz bottle of beer (355 mL), one 5 oz glass of wine (148 mL), or one 1 oz glass of hard liquor (44 mL). General instructions  Schedule regular health, dental, and eye exams.  Stay current with your vaccines.  Tell your health care provider if: ? You often feel depressed. ? You have ever been abused or do not feel safe at home. Summary  Adopting a healthy lifestyle and getting preventive care are important in promoting health and wellness.  Follow your health care provider's instructions about healthy  diet, exercising, and getting tested or screened for diseases.  Follow your health care provider's instructions on monitoring your cholesterol and blood pressure. This information is not intended to replace advice given to you by your health care provider. Make sure you discuss any questions you have with your health care provider. Document Revised: 02/07/2018 Document Reviewed: 02/07/2018 Elsevier Patient Education  2021 Elsevier Inc.  

## 2020-08-04 NOTE — Addendum Note (Signed)
Addended by: Brandy Hale on: 08/04/2020 04:24 PM   Modules accepted: Orders

## 2020-08-05 ENCOUNTER — Other Ambulatory Visit: Payer: Self-pay | Admitting: Family

## 2020-08-05 LAB — COMPREHENSIVE METABOLIC PANEL
ALT: 14 U/L (ref 0–35)
AST: 18 U/L (ref 0–37)
Albumin: 4.2 g/dL (ref 3.5–5.2)
Alkaline Phosphatase: 52 U/L (ref 39–117)
BUN: 15 mg/dL (ref 6–23)
CO2: 28 mEq/L (ref 19–32)
Calcium: 9.5 mg/dL (ref 8.4–10.5)
Chloride: 104 mEq/L (ref 96–112)
Creatinine, Ser: 0.86 mg/dL (ref 0.40–1.20)
GFR: 85.2 mL/min (ref >60.00)
Glucose, Bld: 78 mg/dL (ref 70–99)
Potassium: 3.8 mEq/L (ref 3.5–5.1)
Sodium: 138 mEq/L (ref 135–145)
Total Bilirubin: 0.4 mg/dL (ref 0.2–1.2)
Total Protein: 6.8 g/dL (ref 6.0–8.3)

## 2020-08-05 LAB — LIPID PANEL
Cholesterol: 206 mg/dL — ABNORMAL HIGH (ref 0–200)
HDL: 62.1 mg/dL (ref >39.00)
LDL Cholesterol: 125 mg/dL — ABNORMAL HIGH (ref 0–99)
NonHDL: 143.98
Total CHOL/HDL Ratio: 3
Triglycerides: 96 mg/dL (ref 0.0–149.0)
VLDL: 19.2 mg/dL (ref 0.0–40.0)

## 2020-08-05 LAB — VITAMIN D 25 HYDROXY (VIT D DEFICIENCY, FRACTURES): VITD: 27.24 ng/mL — ABNORMAL LOW (ref 30.00–100.00)

## 2020-08-05 MED ORDER — VITAMIN D (ERGOCALCIFEROL) 1.25 MG (50000 UNIT) PO CAPS: 50000.0000 [IU] | ORAL_CAPSULE | ORAL | 0 refills | Status: DC

## 2020-08-05 NOTE — Progress Notes (Signed)
Established Patient Office Visit  Subjective:  Patient ID: Erika Frazier, female    DOB: 21-May-1980  Age: 39 y.o. MRN: [id number]  CC: In for CPX  HPI Erika Frazier presents for CPX. She reports having left wrist injury in February 2022 that she underwent PT for. It is much better but now has numbness in her left thumb over the past 1-2 weeks. She reports repetitive movement at work.   Patient exercises 3 times per week, walking about 20 min. She also rides her bike.   Sees GYN for female well care.   Past Medical History:  Diagnosis Date  . GERD (gastroesophageal reflux disease)     No past surgical history on file.  Family History  Problem Relation Age of Onset  . Hypertension Mother   . Alcohol abuse Father        now in remission  . Lupus Maternal Aunt   . Heart disease Maternal Aunt     Social History   Socioeconomic History  . Marital status: Single    Spouse name: Not on file  . Number of children: Not on file  . Years of education: Not on file  . Highest education level: Not on file  Occupational History  . Not on file  Tobacco Use  . Smoking status: Former Smoker    Packs/day: 0.00    Years: 1.00    Pack years: 0.00    Types: Cigars  . Smokeless tobacco: Never Used  Vaping Use  . Vaping Use: Never used  Substance and Sexual Activity  . Alcohol use: Yes    Alcohol/week: 0.0 standard drinks  . Drug use: Yes    Types: Marijuana    Comment: weekends  . Sexual activity: Yes    Birth control/protection: Condom    Comment: Would like STD an HIV testing  Other Topics Concern  . Not on file  Social History Narrative   Lives with boyfriend   Social Determinants of Health   Financial Resource Strain: Not on file  Food Insecurity: Not on file  Transportation Needs: Not on file  Physical Activity: Not on file  Stress: Not on file  Social Connections: Not on file  Intimate Partner Violence: Not on file    Outpatient Medications Prior to Visit   Medication Sig Dispense Refill  . albuterol (VENTOLIN HFA) 108 (90 Base) MCG/ACT inhaler Inhale 2 puffs into the lungs every 6 (six) hours as needed.    . Ferrous Fumarate (IRON) 18 MG TBCR Take 18 mg by mouth every other day.    . fluticasone (FLOVENT HFA) 110 MCG/ACT inhaler Inhale 2 puffs into the lungs 2 (two) times daily.    . mirtazapine (REMERON) 15 MG tablet Take by mouth.    . pantoprazole (PROTONIX) 40 MG tablet Take 40 mg by mouth daily.     No facility-administered medications prior to visit.    No Known Allergies  ROS Review of Systems  Constitutional: Negative.   HENT: Negative.   Eyes: Negative.   Respiratory: Negative.   Cardiovascular: Negative.   Gastrointestinal: Negative.   Endocrine: Negative.   Genitourinary: Negative.   Musculoskeletal: Negative for arthralgias and back pain.       Numbness in the left thumb  Skin: Negative.   Allergic/Immunologic: Negative.   Neurological: Negative.   Hematological: Negative.   Psychiatric/Behavioral: Negative.   All other systems reviewed and are negative.     Objective:    Physical Exam Vitals and nursing note reviewed.  Constitutional:      Appearance: Normal appearance. She is normal weight.  HENT:     Head: Normocephalic and atraumatic.     Right Ear: Tympanic membrane and ear canal normal.     Left Ear: Ear canal normal.     Nose: Nose normal.     Mouth/Throat:     Mouth: Mucous membranes are moist.  Eyes:     Extraocular Movements: Extraocular movements intact.     Conjunctiva/sclera: Conjunctivae normal.     Pupils: Pupils are equal, round, and reactive to light.  Cardiovascular:     Rate and Rhythm: Normal rate and regular rhythm.     Pulses: Normal pulses.     Heart sounds: Normal heart sounds.  Pulmonary:     Effort: Pulmonary effort is normal.     Breath sounds: Normal breath sounds.  Abdominal:     General: Abdomen is flat. Bowel sounds are normal.     Palpations: Abdomen is soft.   Musculoskeletal:        General: Normal range of motion.     Cervical back: Normal range of motion and neck supple.  Skin:    General: Skin is warm and dry.  Neurological:     General: No focal deficit present.     Mental Status: She is alert and oriented to person, place, and time.  Psychiatric:        Mood and Affect: Mood normal.        Behavior: Behavior normal.     There were no vitals taken for this visit. Wt Readings from Last 3 Encounters:  08/04/20 144 lb (65.3 kg)  03/18/20 140 lb (63.5 kg)  02/12/20 136 lb (61.7 kg)     Health Maintenance Due  Topic Date Due  . Pneumococcal Vaccine 24-59 Years old (1 of 2 - PPSV23) Never done  . TETANUS/TDAP  Never done    There are no preventive care reminders to display for this patient.  No results found for: TSH Lab Results  Component Value Date   WBC 5.4 02/12/2020   HGB 10.9 (L) 02/12/2020   HCT 35.3 02/12/2020   MCV 76.6 (L) 02/12/2020   PLT 409 (H) 02/12/2020   Lab Results  Component Value Date   NA 138 08/04/2020   K 3.8 08/04/2020   CO2 28 08/04/2020   GLUCOSE 78 08/04/2020   BUN 15 08/04/2020   CREATININE 0.86 08/04/2020   BILITOT 0.4 08/04/2020   ALKPHOS 52 08/04/2020   AST 18 08/04/2020   ALT 14 08/04/2020   PROT 6.8 08/04/2020   ALBUMIN 4.2 08/04/2020   CALCIUM 9.5 08/04/2020   GFR 85.20 08/04/2020   Lab Results  Component Value Date   CHOL 206 (H) 08/04/2020   Lab Results  Component Value Date   HDL 62.10 08/04/2020   Lab Results  Component Value Date   LDLCALC 125 (H) 08/04/2020   Lab Results  Component Value Date   TRIG 96.0 08/04/2020   Lab Results  Component Value Date   CHOLHDL 3 08/04/2020   No results found for: HGBA1C    Assessment & Plan:   Problem List Items Addressed This Visit   None     Meds ordered this encounter  Medications  . Vitamin D, Ergocalciferol, (DRISDOL) 1.25 MG (50000 UNIT) CAPS capsule    Sig: Take 1 capsule (50,000 Units total) by mouth  every 7 (seven) days.    Dispense:  12 capsule    Refill:  0  Follow-up: Vitamin D deficient. 50,000 units prescribed. Continue exercising. Anticipatory guidance provided. Consider referral to neurology if numbness persists.   Kennyth Arnold, FNP

## 2020-08-06 NOTE — Progress Notes (Signed)
Message seen by pt.

## 2020-08-20 DIAGNOSIS — K648 Other hemorrhoids: Secondary | ICD-10-CM | POA: Diagnosis not present

## 2020-08-20 DIAGNOSIS — D509 Iron deficiency anemia, unspecified: Secondary | ICD-10-CM | POA: Diagnosis not present

## 2020-08-20 HISTORY — PX: COLONOSCOPY: SHX174

## 2020-08-24 ENCOUNTER — Telehealth (HOSPITAL_BASED_OUTPATIENT_CLINIC_OR_DEPARTMENT_OTHER): Payer: Self-pay

## 2020-08-24 NOTE — Telephone Encounter (Signed)
Patient called the office today wanting to schedule an appointment for the [facility] procedure. She states that she was informed on Friday that there was no prior-authorization needed. Please advise.

## 2020-08-25 NOTE — Telephone Encounter (Signed)
Reviewed DPR. LMOM at 8:50 letting patient know that she should be receiving a call from our scheduler [place]. Advised patient if she has any questions to give [name] a call back. tbw

## 2020-08-26 ENCOUNTER — Telehealth: Payer: Self-pay | Admitting: *Deleted

## 2020-08-26 NOTE — Telephone Encounter (Signed)
See next phone encounter.

## 2020-08-26 NOTE — Telephone Encounter (Signed)
Call to patient. Left message procedure precert complete and ready to schedule. Procedure date of 7-6 or August will bee options. Left message to call back to [phone].

## 2020-08-26 NOTE — Telephone Encounter (Signed)
Received a voice message from patient stating she is returning a call from [place] about scheduling her [place] surgery. She asks for a call back and states she gets off work at [employer] and that she can call at or after 3.  Will route to [place]. Damarion Mendizabal,RN

## 2020-08-26 NOTE — Telephone Encounter (Signed)
Call to patient. Discussed date option of 09-02-20 or next available would be in August. Patient would like 7-6 date but will have to call back after obtaining time off from work.  Will plan for 7-6 and patient aware need confirmation by Friday 08-28-20 at noon.

## 2020-08-28 ENCOUNTER — Telehealth: Payer: Self-pay | Admitting: *Deleted

## 2020-08-28 NOTE — Telephone Encounter (Signed)
Call to patient to check on plan for surgery scheduled on 09-02-20. Left message requesting call back to [phone].

## 2020-08-28 NOTE — Telephone Encounter (Signed)
Voice mail message from patient. Wants to cancel the tentative date of 09-02-20 and move to 10-13-20.  Case rescheduled and providers notified.  Encounter closed.

## 2020-09-01 ENCOUNTER — Encounter (HOSPITAL_BASED_OUTPATIENT_CLINIC_OR_DEPARTMENT_OTHER): Payer: Self-pay | Admitting: *Deleted

## 2020-09-13 ENCOUNTER — Encounter (HOSPITAL_BASED_OUTPATIENT_CLINIC_OR_DEPARTMENT_OTHER): Payer: Self-pay

## 2020-10-08 ENCOUNTER — Other Ambulatory Visit: Payer: Self-pay

## 2020-10-08 ENCOUNTER — Other Ambulatory Visit (HOSPITAL_BASED_OUTPATIENT_CLINIC_OR_DEPARTMENT_OTHER): Payer: Self-pay | Admitting: Obstetrics & Gynecology

## 2020-10-08 ENCOUNTER — Encounter (HOSPITAL_BASED_OUTPATIENT_CLINIC_OR_DEPARTMENT_OTHER): Payer: Self-pay | Admitting: Obstetrics & Gynecology

## 2020-10-08 NOTE — Progress Notes (Signed)
Spoke w/ via phone for pre-op interview--- Pt Lab needs dos----  Urine preg (per anes)/  pre-op orders pending             Lab results------ no COVID test -----patient states asymptomatic no test needed Arrive at ------- [id number] on 10-13-2020 NPO after MN NO Solid Food.  Clear liquids from MN until--- 0915 Med rec completed Medications to take morning of surgery ----- Protonix, Flovent inhaler Diabetic medication ----- n/a Patient instructed no nail polish to be worn day of surgery Patient instructed to bring photo id and insurance card day of surgery Patient aware to have Driver (ride ) / caregiver for 24 hours after surgery --sig other, Bess Kinds Patient Special Instructions ----- asked to bring rescue inhaler dos Pre-Op special Istructions ----- sent inbox message to dr Sabra Heck in epic, requested orders Patient verbalized understanding of instructions that were given at this phone interview. Patient denies shortness of breath, chest pain, fever, cough at this phone interview.

## 2020-10-13 ENCOUNTER — Encounter (HOSPITAL_BASED_OUTPATIENT_CLINIC_OR_DEPARTMENT_OTHER): Payer: Self-pay | Admitting: Obstetrics & Gynecology

## 2020-10-13 ENCOUNTER — Encounter (HOSPITAL_BASED_OUTPATIENT_CLINIC_OR_DEPARTMENT_OTHER)
Admission: RE | Disposition: A | Discharge status: Home / Self Care | Payer: Self-pay | Source: Home / Self Care | Attending: Obstetrics & Gynecology

## 2020-10-13 ENCOUNTER — Ambulatory Visit (HOSPITAL_BASED_OUTPATIENT_CLINIC_OR_DEPARTMENT_OTHER): Payer: BC Managed Care – PPO | Admitting: Anesthesiology

## 2020-10-13 ENCOUNTER — Ambulatory Visit (HOSPITAL_BASED_OUTPATIENT_CLINIC_OR_DEPARTMENT_OTHER)
Admission: RE | Admit: 2020-10-13 | Discharge: 2020-10-13 | Disposition: A | Discharge status: Home / Self Care | Payer: BC Managed Care – PPO | Attending: Obstetrics & Gynecology | Admitting: Obstetrics & Gynecology

## 2020-10-13 ENCOUNTER — Other Ambulatory Visit (HOSPITAL_BASED_OUTPATIENT_CLINIC_OR_DEPARTMENT_OTHER): Payer: Self-pay | Admitting: Obstetrics & Gynecology

## 2020-10-13 DIAGNOSIS — D630 Anemia in neoplastic disease: Secondary | ICD-10-CM | POA: Diagnosis not present

## 2020-10-13 DIAGNOSIS — N920 Excessive and frequent menstruation with regular cycle: Secondary | ICD-10-CM

## 2020-10-13 DIAGNOSIS — Z7951 Long term (current) use of inhaled steroids: Secondary | ICD-10-CM | POA: Insufficient documentation

## 2020-10-13 DIAGNOSIS — N882 Stricture and stenosis of cervix uteri: Secondary | ICD-10-CM | POA: Diagnosis not present

## 2020-10-13 DIAGNOSIS — D251 Intramural leiomyoma of uterus: Secondary | ICD-10-CM

## 2020-10-13 DIAGNOSIS — Z79899 Other long term (current) drug therapy: Secondary | ICD-10-CM | POA: Diagnosis not present

## 2020-10-13 DIAGNOSIS — Z87891 Personal history of nicotine dependence: Secondary | ICD-10-CM | POA: Insufficient documentation

## 2020-10-13 DIAGNOSIS — J4530 Mild persistent asthma, uncomplicated: Secondary | ICD-10-CM | POA: Diagnosis not present

## 2020-10-13 DIAGNOSIS — D252 Subserosal leiomyoma of uterus: Secondary | ICD-10-CM | POA: Diagnosis not present

## 2020-10-13 DIAGNOSIS — D259 Leiomyoma of uterus, unspecified: Secondary | ICD-10-CM | POA: Insufficient documentation

## 2020-10-13 DIAGNOSIS — K219 Gastro-esophageal reflux disease without esophagitis: Secondary | ICD-10-CM | POA: Diagnosis not present

## 2020-10-13 DIAGNOSIS — D649 Anemia, unspecified: Secondary | ICD-10-CM | POA: Diagnosis not present

## 2020-10-13 DIAGNOSIS — N889 Noninflammatory disorder of cervix uteri, unspecified: Secondary | ICD-10-CM

## 2020-10-13 HISTORY — PX: HYSTEROSCOPY: SHX211

## 2020-10-13 HISTORY — DX: Presence of spectacles and contact lenses: Z97.3

## 2020-10-13 HISTORY — DX: Iron deficiency anemia, unspecified: D50.9

## 2020-10-13 HISTORY — DX: Personal history of other diseases of the female genital tract: Z87.42

## 2020-10-13 HISTORY — DX: Mild persistent asthma, uncomplicated: J45.30

## 2020-10-13 HISTORY — DX: Subserosal leiomyoma of uterus: D25.2

## 2020-10-13 LAB — POCT PREGNANCY, URINE: Preg Test, Ur: NEGATIVE

## 2020-10-13 LAB — CBC
HCT: 35.4 % — ABNORMAL LOW (ref 36.0–46.0)
Hemoglobin: 11.1 g/dL — ABNORMAL LOW (ref 12.0–15.0)
MCH: 23.8 pg — ABNORMAL LOW (ref 26.0–34.0)
MCHC: 31.4 g/dL (ref 30.0–36.0)
MCV: 75.8 fL — ABNORMAL LOW (ref 80.0–100.0)
Platelets: 394 10*3/uL (ref 150–400)
RBC: 4.67 MIL/uL (ref 3.87–5.11)
RDW: 14.1 % (ref 11.5–15.5)
WBC: 5.9 10*3/uL (ref 4.0–10.5)
nRBC: 0 % (ref 0.0–0.2)

## 2020-10-13 SURGERY — HYSTEROSCOPY
Anesthesia: General

## 2020-10-13 MED ORDER — OXYCODONE HCL 5 MG PO TABS
ORAL_TABLET | ORAL | Status: AC
  Filled 2020-10-13: qty 1

## 2020-10-13 MED ORDER — FENTANYL CITRATE (PF) 100 MCG/2ML IJ SOLN
INTRAMUSCULAR | Status: DC | PRN
  Administered 2020-10-13 (×2): 50 ug via INTRAVENOUS

## 2020-10-13 MED ORDER — PHENYLEPHRINE 40 MCG/ML (10ML) SYRINGE FOR IV PUSH (FOR BLOOD PRESSURE SUPPORT)
PREFILLED_SYRINGE | INTRAVENOUS | Status: AC
  Filled 2020-10-13: qty 10

## 2020-10-13 MED ORDER — PROPOFOL 10 MG/ML IV BOLUS
INTRAVENOUS | Status: AC
  Filled 2020-10-13: qty 20

## 2020-10-13 MED ORDER — PROPOFOL 10 MG/ML IV BOLUS
INTRAVENOUS | Status: DC | PRN
  Administered 2020-10-13: 200 mg via INTRAVENOUS

## 2020-10-13 MED ORDER — SODIUM CHLORIDE 0.9 % IR SOLN
Status: DC | PRN
  Administered 2020-10-13: 3000 mL

## 2020-10-13 MED ORDER — FENTANYL CITRATE (PF) 100 MCG/2ML IJ SOLN: 25.0000 ug | INTRAMUSCULAR | Status: DC | PRN

## 2020-10-13 MED ORDER — HYDROCODONE-ACETAMINOPHEN 5-325 MG PO TABS: 1.0000 | ORAL_TABLET | Freq: Four times a day (QID) | ORAL | 0 refills | Status: DC | PRN

## 2020-10-13 MED ORDER — IBUPROFEN 800 MG PO TABS: 800.0000 mg | ORAL_TABLET | Freq: Three times a day (TID) | ORAL | 0 refills | Status: DC | PRN

## 2020-10-13 MED ORDER — ACETAMINOPHEN 325 MG PO TABS: 325.0000 mg | ORAL_TABLET | ORAL | Status: DC | PRN

## 2020-10-13 MED ORDER — AMISULPRIDE (ANTIEMETIC) 5 MG/2ML IV SOLN: 10.0000 mg | Freq: Once | INTRAVENOUS | Status: DC | PRN

## 2020-10-13 MED ORDER — POVIDONE-IODINE 10 % EX SWAB: 2.0000 "application " | Freq: Once | CUTANEOUS | Status: DC

## 2020-10-13 MED ORDER — MIDAZOLAM HCL 2 MG/2ML IJ SOLN
INTRAMUSCULAR | Status: DC | PRN
Start: 2020-10-13 — End: 2020-10-13
  Administered 2020-10-13: 1 mg via INTRAVENOUS

## 2020-10-13 MED ORDER — EPHEDRINE 5 MG/ML INJ
INTRAVENOUS | Status: AC
  Filled 2020-10-13: qty 5

## 2020-10-13 MED ORDER — LIDOCAINE-EPINEPHRINE 1 %-1:100000 IJ SOLN
INTRAMUSCULAR | Status: DC | PRN
  Administered 2020-10-13: 10 mL

## 2020-10-13 MED ORDER — SCOPOLAMINE 1 MG/3DAYS TD PT72: 1.0000 | MEDICATED_PATCH | TRANSDERMAL | Status: DC

## 2020-10-13 MED ORDER — LIDOCAINE HCL (CARDIAC) PF 100 MG/5ML IV SOSY
PREFILLED_SYRINGE | INTRAVENOUS | Status: DC | PRN
  Administered 2020-10-13: 60 mg via INTRAVENOUS

## 2020-10-13 MED ORDER — ROCURONIUM BROMIDE 10 MG/ML (PF) SYRINGE
PREFILLED_SYRINGE | INTRAVENOUS | Status: AC
  Filled 2020-10-13: qty 10

## 2020-10-13 MED ORDER — KETOROLAC TROMETHAMINE 30 MG/ML IJ SOLN
INTRAMUSCULAR | Status: DC | PRN
  Administered 2020-10-13: 30 mg via INTRAVENOUS

## 2020-10-13 MED ORDER — OXYCODONE HCL 5 MG/5ML PO SOLN: 5.0000 mg | Freq: Once | ORAL | Status: AC | PRN

## 2020-10-13 MED ORDER — ONDANSETRON HCL 4 MG/2ML IJ SOLN
INTRAMUSCULAR | Status: AC
  Filled 2020-10-13: qty 2

## 2020-10-13 MED ORDER — MIDAZOLAM HCL 2 MG/2ML IJ SOLN
INTRAMUSCULAR | Status: AC
  Filled 2020-10-13: qty 2

## 2020-10-13 MED ORDER — GLYCOPYRROLATE PF 0.2 MG/ML IJ SOSY
PREFILLED_SYRINGE | INTRAMUSCULAR | Status: AC
  Filled 2020-10-13: qty 1

## 2020-10-13 MED ORDER — DEXAMETHASONE SODIUM PHOSPHATE 10 MG/ML IJ SOLN
INTRAMUSCULAR | Status: AC
  Filled 2020-10-13: qty 2

## 2020-10-13 MED ORDER — DEXAMETHASONE SODIUM PHOSPHATE 4 MG/ML IJ SOLN
INTRAMUSCULAR | Status: DC | PRN
  Administered 2020-10-13: 10 mg via INTRAVENOUS

## 2020-10-13 MED ORDER — FENTANYL CITRATE (PF) 100 MCG/2ML IJ SOLN
INTRAMUSCULAR | Status: AC
  Filled 2020-10-13: qty 2

## 2020-10-13 MED ORDER — ACETAMINOPHEN 160 MG/5ML PO SOLN: 325.0000 mg | ORAL | Status: DC | PRN

## 2020-10-13 MED ORDER — ACETAMINOPHEN 10 MG/ML IV SOLN: 1000.0000 mg | Freq: Once | INTRAVENOUS | Status: DC | PRN

## 2020-10-13 MED ORDER — LIDOCAINE HCL (PF) 2 % IJ SOLN
INTRAMUSCULAR | Status: AC
  Filled 2020-10-13: qty 5

## 2020-10-13 MED ORDER — PROMETHAZINE HCL 25 MG/ML IJ SOLN: 6.2500 mg | INTRAMUSCULAR | Status: DC | PRN

## 2020-10-13 MED ORDER — STERILE WATER FOR IRRIGATION IR SOLN
Status: DC | PRN
  Administered 2020-10-13: 500 mL

## 2020-10-13 MED ORDER — LACTATED RINGERS IV SOLN: INTRAVENOUS | Status: DC

## 2020-10-13 MED ORDER — DEXAMETHASONE SODIUM PHOSPHATE 10 MG/ML IJ SOLN
INTRAMUSCULAR | Status: AC
  Filled 2020-10-13: qty 1

## 2020-10-13 MED ORDER — OXYCODONE HCL 5 MG PO TABS
5.0000 mg | ORAL_TABLET | Freq: Once | ORAL | Status: AC | PRN
  Administered 2020-10-13: 5 mg via ORAL

## 2020-10-13 SURGICAL SUPPLY — 21 items
CATH FOLEY 2WAY SLVR  5CC 14FR (CATHETERS) ×2
CATH FOLEY 2WAY SLVR 5CC 14FR (CATHETERS) ×1 IMPLANT
DILATOR CANAL MILEX (MISCELLANEOUS) ×2 IMPLANT
ELECT DISPERSIVE SONATA (MISCELLANEOUS) ×4 IMPLANT
GAUZE 4X4 16PLY ~~LOC~~+RFID DBL (SPONGE) ×2 IMPLANT
GLOVE SURG ENC MOIS LTX SZ6.5 (GLOVE) ×2 IMPLANT
GLOVE SURG GAMMEX LF SZ6.5 (GLOVE) ×2 IMPLANT
GLOVE SURG LTX SZ6.5 (GLOVE) ×4 IMPLANT
GLOVE SURG NEOP MICRO LF SZ6.5 (GLOVE) ×2 IMPLANT
GLOVE SURG UNDER POLY LF SZ7 (GLOVE) ×4 IMPLANT
GOWN STRL REUS W/ TWL LRG LVL3 (GOWN DISPOSABLE) ×1 IMPLANT
GOWN STRL REUS W/TWL LRG LVL3 (GOWN DISPOSABLE) ×6 IMPLANT
HANDPIECE RFA SONATA (MISCELLANEOUS) ×2 IMPLANT
IV NS IRRIG 3000ML ARTHROMATIC (IV SOLUTION) ×2 IMPLANT
KIT PROCEDURE FLUENT (KITS) ×2 IMPLANT
KIT TURNOVER CYSTO (KITS) ×2 IMPLANT
PACK VAGINAL MINOR WOMEN LF (CUSTOM PROCEDURE TRAY) ×2 IMPLANT
PAD OB MATERNITY 4.3X12.25 (PERSONAL CARE ITEMS) ×2 IMPLANT
SEAL ROD LENS SCOPE MYOSURE (ABLATOR) ×2 IMPLANT
TOWEL OR 17X26 10 PK STRL BLUE (TOWEL DISPOSABLE) ×2 IMPLANT
WATER STERILE IRR 500ML POUR (IV SOLUTION) ×2 IMPLANT

## 2020-10-13 NOTE — Op Note (Signed)
10/13/2020  1:39 PM  PATIENT:  Erika Frazier  40 y.o. female  PRE-OPERATIVE DIAGNOSIS:  Fibroids, anemia  POST-OPERATIVE DIAGNOSIS:  Fibroids, anemia  PROCEDURE:  Procedure(s): HYSTEROSCOPY, DILATION AND CURETTAGE Attempted Radio Frequency Ablation with Sonata  SURGEON:  Megan Salon  ASSISTANTS: OR staff  ANESTHESIA:   general  ESTIMATED BLOOD LOSS: 10cc  BLOOD ADMINISTERED:none   FLUIDS: 800cc LR  UOP: 30cc and pt voided before procedure  SPECIMEN:  endometrial curetting  DISPOSITION OF SPECIMEN:  PATHOLOGY  FINDINGS: posterior fibroid creating a ridge on the posterior vaginal wall that prevented cervical dilation above 7cm  DESCRIPTION OF OPERATION: Patient was taken to the operating room.  She is placed in the supine position. SCDs were on her lower extremities and functioning properly. General anesthesia with an LMA was administered without difficulty. Dr. Smith Robert, anesthesia, oversaw case.  Legs were then placed in the [place] in the low lithotomy position. The legs were lifted to the high lithotomy position and the Betadine prep was used on the inner thighs perineum and vagina x3. Patient was draped in a normal standard fashion. Pt voided before going back to the OR. A bivalve speculum was placed the vagina. The anterior lip of the cervix was grasped with single-tooth tenaculum.  A paracervical block of 1% lidocaine mixed one-to-one with epinephrine (1:100,000 units).  10 cc was used total. The cervix is dilated up to #21 [facility] dilators. The endometrial cavity sounded to 7 cm.  Myosure hysteroscope was obtained.  This was tried to pass through cervix multiple times.  There was a posterior ridge just at the internal os that made cervical dilation difficult.  1/2 hagar dilators obtained.  I was able to dilate up to a 7 but no more.  The hysteroscope was eventually passed through the cervix.  Cavity was intact.  No lesions noted.  The hysteroscope was removed.  Using  a #1 smooth curette, the endometrial cavity was curetted until rough gritty texture was noted in all quadrants.  Attempt was made to pass the [place] device.  The tenaculum tore from the cervix multiple times.  The device did pass enough to be able to see the posterior fibroid was creating the difficulty in dilating the cevix and passing the device.  After multiple attempt to dilate the cervix more than 7cm and after working on cervical dilation for more than an hour, decision made to stop the procedure. At this point, there was not an adequate place on the cervix to graspe with the tenaculum.  I did not feel grasping high on the cervix was safe as I did not want a bladder injury.  Procedure ended.    There was no bleeding from the cervix at this point.  The speculum was removed from the vagina. The prep was cleansed of the patient's skin. The legs are positioned back in the supine position. Sponge, lap, needle, initially counts were correct x2. Patient was taken to recovery in stable condition.  COUNTS:  YES  PLAN OF CARE: Transfer to PACU

## 2020-10-13 NOTE — Transfer of Care (Signed)
Immediate Anesthesia Transfer of Care Note  Patient: Erika Frazier  Procedure(s) Performed: HYSTEROSCOPY, DILATION AND CURETTAGE Attempted Radio Frequency Ablation with Sonata  Patient Location: PACU  Anesthesia Type:General  Level of Consciousness: awake, alert  and patient cooperative  Airway & Oxygen Therapy: Patient Spontanous Breathing  Post-op Assessment: Report given to RN and Post -op Vital signs reviewed and stable  Post vital signs: Reviewed and stable  Last Vitals:  Vitals Value Taken Time  BP    Temp    Pulse    Resp 16 10/13/20 1344  SpO2    Vitals shown include unvalidated device data.  Last Pain:  Vitals:   10/13/20 1045  TempSrc: Oral  PainSc: 3       Patients Stated Pain Goal: 6 (Q000111Q [id number])  Complications: No notable events documented.

## 2020-10-13 NOTE — Anesthesia Preprocedure Evaluation (Signed)
Anesthesia Evaluation  Patient identified by MRN, date of birth, ID band Patient awake    Reviewed: Allergy & Precautions, NPO status , Patient's Chart, lab work & pertinent test results  Airway Mallampati: I  TM Distance: >3 FB Neck ROM: Full    Dental  (+) Dental Advisory Given, Teeth Intact   Pulmonary asthma , former smoker,    breath sounds clear to auscultation       Cardiovascular  Rhythm:Regular Rate:Normal     Neuro/Psych PSYCHIATRIC DISORDERS    GI/Hepatic Neg liver ROS, GERD  Controlled and Medicated,  Endo/Other  negative endocrine ROS  Renal/GU negative Renal ROS     Musculoskeletal negative musculoskeletal ROS (+)   Abdominal Normal abdominal exam  (+)   Peds  Hematology negative hematology ROS (+)   Anesthesia Other Findings   Reproductive/Obstetrics                             Anesthesia Physical Anesthesia Plan  ASA: 1  Anesthesia Plan: General   Post-op Pain Management:    Induction: Intravenous  PONV Risk Score and Plan: 4 or greater and Ondansetron, Dexamethasone, Midazolam and Scopolamine patch - Pre-op  Airway Management Planned: LMA  Additional Equipment: None  Intra-op Plan:   Post-operative Plan: Extubation in OR  Informed Consent: I have reviewed the patients History and Physical, chart, labs and discussed the procedure including the risks, benefits and alternatives for the proposed anesthesia with the patient or authorized representative who has indicated his/her understanding and acceptance.     Dental advisory given  Plan Discussed with: CRNA  Anesthesia Plan Comments:         Anesthesia Quick Evaluation

## 2020-10-13 NOTE — H&P (Signed)
Erika Frazier is an 40 y.o. female G66 SAAF with symptomatic uterine fibroids and mild anemia here for treatment with hysteroscopy and Sonata RFA.  Last ultrasound showed fibroids 5.1 x 3.9 x 3.9cm and 3.5 x 2.3 x 2.6 cm.  Pt does take iron to help with her anemia.  Myomectomy, [place], IUD and other hormonal therapy, hysterectomy have all been discussed.  Pt is not ready for hysterectomy.  She is aware that pregnancy after this is not currently recommended but is being monitored closely for changing in indications.  She does hope for pregnancy in the future but is not actively trying and is not planning this at this time.  She does not want a procedure that will eliminated the possibility of pregnancy.  Other risks including, bleeding, post operative pain, infection all discussed.  Pt here and ready to proceed.  Pertinent Gynecological History: Menses:  regular but heavy Contraception: none DES exposure: denies Blood transfusions: none Sexually transmitted diseases: no past history Previous GYN Procedures:  none   Last mammogram: not indicated yet due to age Last pap: normal Date: 03/18/2020 OB History: G0, P0   Menstrual History: Patient's last menstrual period was 09/28/2020 (exact date).    Past Medical History:  Diagnosis Date   Fibroids, subserous    GERD (gastroesophageal reflux disease)    History of ovarian cyst    IDA (iron deficiency anemia)    Mild persistent asthma    Wears glasses     Past Surgical History:  Procedure Laterality Date   COLONOSCOPY  08/20/2020   @ [place] [facility]   ESOPHAGOGASTRODUODENOSCOPY  01/07/2020   @ [place] [facility]    Family History  Problem Relation Age of Onset   Hypertension Mother    Alcohol abuse Father        now in remission   Lupus Maternal Aunt    Heart disease Maternal Aunt     Social History:  reports that she quit smoking about 12 years ago. Her smoking use included cigarettes. She has never used smokeless tobacco.  She reports that she does not currently use alcohol. She reports current drug use. Drug: Marijuana.  Allergies: No Known Allergies  Medications Prior to Admission  Medication Sig Dispense Refill Last Dose   albuterol (VENTOLIN HFA) 108 (90 Base) MCG/ACT inhaler Inhale 2 puffs into the lungs every 6 (six) hours as needed.   10/06/2020   Calcium Carb-Cholecalciferol (CALCIUM + D3 PO) Take by mouth every other day. PER PT TAKES TWO TABLESPOONS QOD   10/13/2020   ferrous sulfate (FER-IN-SOL) 75 (15 Fe) MG/ML SOLN Take by mouth every other day. PER PT ONE OUNCE QOD   10/13/2020   fluticasone (FLOVENT HFA) 110 MCG/ACT inhaler Inhale 2 puffs into the lungs 2 (two) times daily.   10/12/2020 at 2200   mirtazapine (REMERON) 15 MG tablet Take 15 mg by mouth at bedtime.   10/11/2020 at 2200   pantoprazole (PROTONIX) 40 MG tablet Take 40 mg by mouth daily.   10/13/2020 at 0600    Review of Systems  Genitourinary:  Positive for menstrual problem.  All other systems reviewed and are negative.  Blood pressure (!) 150/72, pulse 79, temperature 98.1 F (36.7 C), temperature source Oral, resp. rate 16, height '5\' 2"'$  (1.575 m), weight 62.6 kg, last menstrual period 09/28/2020, SpO2 100 %. Physical Exam Constitutional:      Appearance: Normal appearance.  HENT:     Head: Normocephalic.  Cardiovascular:     Rate and  Rhythm: Normal rate and regular rhythm.  Pulmonary:     Effort: Pulmonary effort is normal.     Breath sounds: Normal breath sounds.  Musculoskeletal:     Cervical back: Normal range of motion.  Neurological:     General: No focal deficit present.     Mental Status: She is alert.  Psychiatric:        Mood and Affect: Mood normal.    Results for orders placed or performed during the hospital encounter of 10/13/20 (from the past 24 hour(s))  CBC     Status: Abnormal   Collection Time: 10/13/20 10:26 AM  Result Value Ref Range   WBC 5.9 4.0 - 10.5 K/uL   RBC 4.67 3.87 - 5.11 MIL/uL    Hemoglobin 11.1 (L) 12.0 - 15.0 g/dL   HCT 35.4 (L) 36.0 - 46.0 %   MCV 75.8 (L) 80.0 - 100.0 fL   MCH 23.8 (L) 26.0 - 34.0 pg   MCHC 31.4 30.0 - 36.0 g/dL   RDW 14.1 11.5 - 15.5 %   Platelets 394 150 - 400 K/uL   nRBC 0.0 0.0 - 0.2 %  Pregnancy, urine POC     Status: None   Collection Time: 10/13/20 10:28 AM  Result Value Ref Range   Preg Test, Ur NEGATIVE NEGATIVE    No results found.  Assessment/Plan: 40 yo G0 SAAF with symptomatic uterine fibroids, mild anemia here for treatment with hysteroscopy, possible D&C, and Sonata RFA of fibroids.  Questions answered.  Pt ready to proceed.  Megan Salon 10/13/2020, 11:54 AM

## 2020-10-13 NOTE — Anesthesia Postprocedure Evaluation (Signed)
Anesthesia Post Note  Patient: Erika Frazier  Procedure(s) Performed: HYSTEROSCOPY, DILATION AND CURETTAGE Attempted Radio Frequency Ablation with Sonata     Patient location during evaluation: PACU Anesthesia Type: General Level of consciousness: awake and alert Pain management: pain level controlled Vital Signs Assessment: post-procedure vital signs reviewed and stable Respiratory status: spontaneous breathing, nonlabored ventilation, respiratory function stable and patient connected to nasal cannula oxygen Cardiovascular status: blood pressure returned to baseline and stable Postop Assessment: no apparent nausea or vomiting Anesthetic complications: no   No notable events documented.  Last Vitals:  Vitals:   10/13/20 1415 10/13/20 1432  BP: (!) 156/94 (!) 148/102  Pulse: 68 83  Resp: 15 16  Temp:  36.6 C  SpO2: 100% 100%    Last Pain:  Vitals:   10/13/20 1445  TempSrc:   PainSc: [place]

## 2020-10-13 NOTE — Anesthesia Procedure Notes (Signed)
Procedure Name: LMA Insertion Date/Time: 10/13/2020 12:28 PM Performed by: Georgeanne Nim, CRNA Pre-anesthesia Checklist: Patient identified, Emergency Drugs available, Suction available, Patient being monitored and Timeout performed Patient Re-evaluated:Patient Re-evaluated prior to induction Oxygen Delivery Method: Circle system utilized Preoxygenation: Pre-oxygenation with 100% oxygen Induction Type: IV induction Ventilation: Mask ventilation without difficulty LMA: LMA inserted LMA Size: 4.0 Number of attempts: 1 Placement Confirmation: positive ETCO2, CO2 detector and breath sounds checked- equal and bilateral Tube secured with: Tape Dental Injury: Teeth and Oropharynx as per pre-operative assessment

## 2020-10-13 NOTE — Discharge Instructions (Signed)
  Post Anesthesia Home Care Instructions  Activity: Get plenty of rest for the remainder of the day. A responsible individual must stay with you for 24 hours following the procedure.  For the next 24 hours, DO NOT: -Drive a car -Paediatric nurse -Drink alcoholic beverages -Take any medication unless instructed by your physician -Make any legal decisions or sign important papers.  Meals: Start with liquid foods such as gelatin or soup. Progress to regular foods as tolerated. Avoid greasy, spicy, heavy foods. If nausea and/or vomiting occur, drink only clear liquids until the nausea and/or vomiting subsides. Call your physician if vomiting continues.  Special Instructions/Symptoms: Your throat may feel dry or sore from the anesthesia or the breathing tube placed in your throat during surgery. If this causes discomfort, gargle with warm salt water. The discomfort should disappear within 24 hours.  DISCHARGE INSTRUCTIONS: D&C  The following instructions have been prepared to help you care for yourself upon your return home.   Personal hygiene:  Use sanitary pads for vaginal drainage, not tampons.  Shower the day after your procedure.  NO tub baths, pools or Jacuzzis for 2-3 weeks.  Wipe front to back after using the bathroom.  Activity and limitations:  Do NOT drive or operate any equipment for 24 hours. The effects of anesthesia are still present and drowsiness may result.  Do NOT rest in bed all day.  Walking is encouraged.  Walk up and down stairs slowly.  You may resume your normal activity in one to two days or as indicated by your physician.  Sexual activity: NO intercourse for at least 2 weeks after the procedure, or as indicated by your physician.  Diet: Eat a light meal as desired this evening. You may resume your usual diet tomorrow.  Return to work: You may resume your work activities in one to two days or as indicated by your doctor.  What to expect after your surgery:  Expect to have vaginal bleeding/discharge for 2-3 days and spotting for up to 10 days. It is not unusual to have soreness for up to 1-2 weeks. You may have a slight burning sensation when you urinate for the first day. Mild cramps may continue for a couple of days. You may have a regular period in 2-6 weeks.  Call your doctor for any of the following:  Excessive vaginal bleeding, saturating and changing one pad every hour.  Inability to urinate 6 hours after discharge from hospital.  Pain not relieved by pain medication.  Fever of 100.4 F or greater.  Unusual vaginal discharge or odor.

## 2020-10-14 ENCOUNTER — Encounter (HOSPITAL_BASED_OUTPATIENT_CLINIC_OR_DEPARTMENT_OTHER): Payer: Self-pay | Admitting: Obstetrics & Gynecology

## 2020-10-14 LAB — SURGICAL PATHOLOGY

## 2020-10-15 ENCOUNTER — Encounter (HOSPITAL_BASED_OUTPATIENT_CLINIC_OR_DEPARTMENT_OTHER): Payer: Self-pay

## 2020-10-21 ENCOUNTER — Encounter (HOSPITAL_BASED_OUTPATIENT_CLINIC_OR_DEPARTMENT_OTHER): Payer: Self-pay

## 2020-10-30 DIAGNOSIS — N920 Excessive and frequent menstruation with regular cycle: Secondary | ICD-10-CM

## 2020-10-30 DIAGNOSIS — D251 Intramural leiomyoma of uterus: Secondary | ICD-10-CM

## 2020-10-30 DIAGNOSIS — N882 Stricture and stenosis of cervix uteri: Secondary | ICD-10-CM

## 2020-10-30 DIAGNOSIS — N889 Noninflammatory disorder of cervix uteri, unspecified: Secondary | ICD-10-CM

## 2020-11-06 ENCOUNTER — Other Ambulatory Visit (HOSPITAL_BASED_OUTPATIENT_CLINIC_OR_DEPARTMENT_OTHER): Payer: Self-pay | Admitting: Obstetrics & Gynecology

## 2020-11-06 MED ORDER — MYFEMBREE 40-1-0.5 MG PO TABS: 1.0000 | ORAL_TABLET | Freq: Every day | ORAL | 2 refills | Status: DC

## 2020-11-06 NOTE — Progress Notes (Signed)
Called pt personally.  Pt still desires to proceed with RFA if possible for fibroid treatment.  [employer] has approved doing again this year.  D/w pt.  Will start myfembree daily and recheck PUS in 3 months.  Plan to proceed with surgery in early December.  Pt comfortable with plan.  Questions answered.

## 2020-11-10 ENCOUNTER — Telehealth (HOSPITAL_BASED_OUTPATIENT_CLINIC_OR_DEPARTMENT_OTHER): Payer: Self-pay | Admitting: *Deleted

## 2020-11-10 NOTE — Telephone Encounter (Signed)
LMOVM that PA for Myfembree had been completed. Advised that she should be able to call the pharmacy and get the prescription filled and be able to use the savings card as well.   Insurance PA case [id number]. Valid 11/10/20-11/11/22

## 2020-11-12 ENCOUNTER — Encounter (HOSPITAL_BASED_OUTPATIENT_CLINIC_OR_DEPARTMENT_OTHER): Payer: Self-pay | Admitting: *Deleted

## 2020-11-13 ENCOUNTER — Other Ambulatory Visit (HOSPITAL_BASED_OUTPATIENT_CLINIC_OR_DEPARTMENT_OTHER): Payer: Self-pay | Admitting: Obstetrics & Gynecology

## 2020-11-13 DIAGNOSIS — D252 Subserosal leiomyoma of uterus: Secondary | ICD-10-CM

## 2020-11-13 DIAGNOSIS — D509 Iron deficiency anemia, unspecified: Secondary | ICD-10-CM

## 2020-12-15 ENCOUNTER — Encounter: Payer: Self-pay | Admitting: Physician Assistant

## 2020-12-15 NOTE — Telephone Encounter (Signed)
Lvm for patient to call back

## 2020-12-23 ENCOUNTER — Encounter: Payer: Self-pay | Admitting: Physician Assistant

## 2020-12-23 ENCOUNTER — Telehealth (INDEPENDENT_AMBULATORY_CARE_PROVIDER_SITE_OTHER): Payer: BC Managed Care – PPO | Admitting: Physician Assistant

## 2020-12-23 VITALS — Ht 62.0 in | Wt 136.0 lb

## 2020-12-23 DIAGNOSIS — R634 Abnormal weight loss: Secondary | ICD-10-CM

## 2020-12-23 DIAGNOSIS — K2100 Gastro-esophageal reflux disease with esophagitis, without bleeding: Secondary | ICD-10-CM | POA: Diagnosis not present

## 2020-12-23 MED ORDER — MIRTAZAPINE 7.5 MG PO TABS: 7.5000 mg | ORAL_TABLET | Freq: Every day | ORAL | 1 refills | Status: DC

## 2020-12-23 NOTE — Progress Notes (Signed)
Virtual Visit via Video   I connected with Erika Frazier on 12/23/20 at 11:00 AM EDT by a video enabled telemedicine application and verified that I am speaking with the correct person using two identifiers. Location patient: Home Location provider: [facility], Office Persons participating in the virtual visit: Nea Riedlinger, Inda Coke PA-C, Anselmo Pickler, LPN   I discussed the limitations of evaluation and management by telemedicine and the availability of in person appointments. The patient expressed understanding and agreed to proceed.  I acted as a Education administrator for [employer], PA-C [employer], LPN   Subjective:   HPI:   GERD She had EGD in Nov 2021, dx with GERD, and had esophagus stretched with Dr. Rolm Bookbinder with [facility]. She states that she thinks she had some biopsies done but she does not know the results. She is on protonix 40 mg daily and takes this regularly. She is having ongoing burning sensation in her throat, even when she drinks plain water. She had colonoscopy in June 2022 due to anemia. She states that this was normal.  She has lost about 8 lb since her colonoscopy. She does have rx for remeron 15 mg daily. She cannot take every day because it makes her too tired in the mornings. Denies: rectal bleeding, nausea, vomiting, early satiety.  When she does eat, she is trying to eat high protein foods to help with weight maintenance/gain.  She is interested in seeing a new GI provider.  We reviewed recent blood work that she had over the summer.   ROS: See pertinent positives and negatives per HPI.  Patient Active Problem List   Diagnosis Date Noted   Intramural uterine fibroid    Menorrhagia with regular cycle    Cervical stenosis (uterine cervix)    Fibroids, subserous 02/12/2020   Adjustment disorder with depressed mood 10/04/2019   Mild persistent asthma without complication 02/77/4128   Iron deficiency anemia 05/13/2013    Social History    Tobacco Use   Smoking status: Former    Years: 5.00    Types: Cigarettes    Quit date: 2010    Years since quitting: 12.8   Smokeless tobacco: Never  Substance Use Topics   Alcohol use: Not Currently    Comment: occasional    Current Outpatient Medications:    albuterol (VENTOLIN HFA) 108 (90 Base) MCG/ACT inhaler, Inhale 2 puffs into the lungs every 6 (six) hours as needed., Disp: , Rfl:    Calcium Carb-Cholecalciferol (CALCIUM + D3 PO), Take by mouth every other day. PER PT TAKES TWO TABLESPOONS QOD, Disp: , Rfl:    ferrous sulfate (FER-IN-SOL) 75 (15 Fe) MG/ML SOLN, Take by mouth every other day. PER PT ONE OUNCE QOD, Disp: , Rfl:    fluticasone (FLOVENT HFA) 110 MCG/ACT inhaler, Inhale 2 puffs into the lungs 2 (two) times daily., Disp: , Rfl:    HYDROcodone-acetaminophen (NORCO/VICODIN) 5-325 MG tablet, Take 1 tablet by mouth every 6 (six) hours as needed for moderate pain., Disp: 10 tablet, Rfl: 0   ibuprofen (ADVIL) 800 MG tablet, Take 1 tablet (800 mg total) by mouth every 8 (eight) hours as needed., Disp: 30 tablet, Rfl: 0   mirtazapine (REMERON) 7.5 MG tablet, Take 1 tablet (7.5 mg total) by mouth at bedtime., Disp: 90 tablet, Rfl: 1   pantoprazole (PROTONIX) 40 MG tablet, Take 40 mg by mouth daily., Disp: , Rfl:   No Known Allergies  Objective:   VITALS: Per patient if applicable, see vitals. GENERAL:  Alert, appears well and in no acute distress. HEENT: Atraumatic, conjunctiva clear, no obvious abnormalities on inspection of external nose and ears. NECK: Normal movements of the head and neck. CARDIOPULMONARY: No increased WOB. Speaking in clear sentences. I:E ratio WNL.  MS: Moves all visible extremities without noticeable abnormality. PSYCH: Pleasant and cooperative, well-groomed. Speech normal rate and rhythm. Affect is appropriate. Insight and judgement are appropriate. Attention is focused, linear, and appropriate.  NEURO: CN grossly intact. Oriented as arrived to  appointment on time with no prompting. Moves both UE equally.  SKIN: No obvious lesions, wounds, erythema, or cyanosis noted on face or hands.  Assessment and Plan:   Lodie was seen today for weight loss.  Diagnoses and all orders for this visit:  Unintentional weight loss; Gastroesophageal reflux disease with esophagitis, unspecified whether hemorrhage Uncontrolled Discussed that we should check her TSH to complete initial set of blood work to eval for ongoing weight loss, however I do think we should have her re-visit with GI before more thorough evaluation for unintentional weight loss as I suspect that this is contributing Referral to [facility] Continue high protein meals/snacks as able Trial remeron 7.5 mg daily to see if she has better overall improvement with this lower consistent dose Follow-up with [name] as needed  -     Cancel: TSH -     TSH; Future -     Ambulatory referral to Gastroenterology  Other orders -     mirtazapine (REMERON) 7.5 MG tablet; Take 1 tablet (7.5 mg total) by mouth at bedtime.   I discussed the assessment and treatment plan with the patient. The patient was provided an opportunity to ask questions and all were answered. The patient agreed with the plan and demonstrated an understanding of the instructions.   The patient was advised to call back or seek an in-person evaluation if the symptoms worsen or if the condition fails to improve as anticipated.   CMA or LPN served as scribe during this visit. History, Physical, and Plan performed by medical provider. The above documentation has been reviewed and is accurate and complete.   [place], [place] 12/23/2020

## 2020-12-26 ENCOUNTER — Encounter (HOSPITAL_BASED_OUTPATIENT_CLINIC_OR_DEPARTMENT_OTHER): Payer: Self-pay

## 2020-12-28 ENCOUNTER — Encounter (HOSPITAL_BASED_OUTPATIENT_CLINIC_OR_DEPARTMENT_OTHER): Payer: Self-pay | Admitting: *Deleted

## 2020-12-28 ENCOUNTER — Other Ambulatory Visit (HOSPITAL_COMMUNITY)
Admission: RE | Admit: 2020-12-28 | Discharge: 2020-12-28 | Disposition: A | Discharge status: Home / Self Care | Payer: BC Managed Care – PPO | Source: Ambulatory Visit | Attending: Obstetrics & Gynecology | Admitting: Obstetrics & Gynecology

## 2020-12-28 ENCOUNTER — Other Ambulatory Visit: Payer: Self-pay

## 2020-12-28 ENCOUNTER — Ambulatory Visit (INDEPENDENT_AMBULATORY_CARE_PROVIDER_SITE_OTHER): Payer: BC Managed Care – PPO | Admitting: *Deleted

## 2020-12-28 DIAGNOSIS — N898 Other specified noninflammatory disorders of vagina: Secondary | ICD-10-CM | POA: Diagnosis not present

## 2020-12-28 NOTE — Progress Notes (Signed)
Pt here to do self swab for complaints of yellow discharge. Denies other symptoms. Advised that we would contact her once test has resulted.

## 2020-12-29 LAB — CERVICOVAGINAL ANCILLARY ONLY
Bacterial Vaginitis (gardnerella): POSITIVE — AB
Candida Glabrata: NEGATIVE
Candida Vaginitis: NEGATIVE
Chlamydia: NEGATIVE
Comment: NEGATIVE
Comment: NEGATIVE
Comment: NEGATIVE
Comment: NEGATIVE
Comment: NEGATIVE
Comment: NORMAL
Neisseria Gonorrhea: NEGATIVE
Trichomonas: NEGATIVE

## 2020-12-30 MED ORDER — METRONIDAZOLE 500 MG PO TABS: 500.0000 mg | ORAL_TABLET | Freq: Two times a day (BID) | ORAL | 0 refills | Status: DC

## 2021-01-01 ENCOUNTER — Ambulatory Visit: Payer: BC Managed Care – PPO | Admitting: Physician Assistant

## 2021-01-01 NOTE — Progress Notes (Incomplete)
Erika Frazier is a 40 y.o. female here for possible pregnancy.  SCRIBE STATEMENT  History of Present Illness:   No chief complaint on file.   HPI  *** Past Medical History:  Diagnosis Date   Fibroids, subserous    GERD (gastroesophageal reflux disease)    History of ovarian cyst    IDA (iron deficiency anemia)    Mild persistent asthma    Wears glasses      Social History   Tobacco Use   Smoking status: Former    Years: 5.00    Types: Cigarettes    Quit date: 2010    Years since quitting: 12.8   Smokeless tobacco: Never  Vaping Use   Vaping Use: Never used  Substance Use Topics   Alcohol use: Not Currently    Comment: occasional   Drug use: Yes    Types: Marijuana    Comment: 10-08-2020  per pt smokes average 3 times per week    Past Surgical History:  Procedure Laterality Date   COLONOSCOPY  08/20/2020   @ [facility]   ESOPHAGOGASTRODUODENOSCOPY  01/07/2020   @ [facility]   HYSTEROSCOPY N/A 10/13/2020   Procedure: HYSTEROSCOPY, DILATION AND CURETTAGE;  Surgeon: Megan Salon, MD;  Location: [facility];  Service: Gynecology;  Laterality: N/A;    Family History  Problem Relation Age of Onset   Hypertension Mother    Alcohol abuse Father        now in remission   Lupus Maternal Aunt    Heart disease Maternal Aunt     No Known Allergies  Current Medications:   Current Outpatient Medications:    albuterol (VENTOLIN HFA) 108 (90 Base) MCG/ACT inhaler, Inhale 2 puffs into the lungs every 6 (six) hours as needed., Disp: , Rfl:    Calcium Carb-Cholecalciferol (CALCIUM + D3 PO), Take by mouth every other day. PER PT TAKES TWO TABLESPOONS QOD, Disp: , Rfl:    ferrous sulfate (FER-IN-SOL) 75 (15 Fe) MG/ML SOLN, Take by mouth every other day. PER PT ONE OUNCE QOD, Disp: , Rfl:    fluticasone (FLOVENT HFA) 110 MCG/ACT inhaler, Inhale 2 puffs into the lungs 2 (two) times daily., Disp: , Rfl:    HYDROcodone-acetaminophen  (NORCO/VICODIN) 5-325 MG tablet, Take 1 tablet by mouth every 6 (six) hours as needed for moderate pain., Disp: 10 tablet, Rfl: 0   ibuprofen (ADVIL) 800 MG tablet, Take 1 tablet (800 mg total) by mouth every 8 (eight) hours as needed., Disp: 30 tablet, Rfl: 0   metroNIDAZOLE (FLAGYL) 500 MG tablet, Take 1 tablet (500 mg total) by mouth 2 (two) times daily., Disp: 14 tablet, Rfl: 0   mirtazapine (REMERON) 7.5 MG tablet, Take 1 tablet (7.5 mg total) by mouth at bedtime., Disp: 90 tablet, Rfl: 1   pantoprazole (PROTONIX) 40 MG tablet, Take 40 mg by mouth daily., Disp: , Rfl:    Review of Systems:   ROS Negative unless otherwise specified per HPI. Vitals:   There were no vitals filed for this visit.   There is no height or weight on file to calculate BMI.  Physical Exam:   Physical Exam  Assessment and Plan:      I,Havlyn C Ratchford,acting as a scribe for Inda Coke, PA.,have documented all relevant documentation on the behalf of Inda Coke, PA,as directed by  Inda Coke, PA while in the presence of Inda Coke, [place].   ***  Inda Coke, PA-C

## 2021-01-04 ENCOUNTER — Ambulatory Visit: Payer: BC Managed Care – PPO | Admitting: Physician Assistant

## 2021-01-04 NOTE — Progress Notes (Incomplete)
Erika Frazier is a 40 y.o. female here for a {New prob or follow up:31724}.  SCRIBE STATEMENT  History of Present Illness:   No chief complaint on file.   HPI  Past Medical History:  Diagnosis Date   Fibroids, subserous    GERD (gastroesophageal reflux disease)    History of ovarian cyst    IDA (iron deficiency anemia)    Mild persistent asthma    Wears glasses      Social History   Tobacco Use   Smoking status: Former    Years: 5.00    Types: Cigarettes    Quit date: 2010    Years since quitting: 12.8   Smokeless tobacco: Never  Vaping Use   Vaping Use: Never used  Substance Use Topics   Alcohol use: Not Currently    Comment: occasional   Drug use: Yes    Types: Marijuana    Comment: 10-08-2020  per pt smokes average 3 times per week    Past Surgical History:  Procedure Laterality Date   COLONOSCOPY  08/20/2020   @ [facility]   ESOPHAGOGASTRODUODENOSCOPY  01/07/2020   @ [facility]   HYSTEROSCOPY N/A 10/13/2020   Procedure: HYSTEROSCOPY, DILATION AND CURETTAGE;  Surgeon: Megan Salon, MD;  Location: [facility];  Service: Gynecology;  Laterality: N/A;    Family History  Problem Relation Age of Onset   Hypertension Mother    Alcohol abuse Father        now in remission   Lupus Maternal Aunt    Heart disease Maternal Aunt     No Known Allergies  Current Medications:   Current Outpatient Medications:    albuterol (VENTOLIN HFA) 108 (90 Base) MCG/ACT inhaler, Inhale 2 puffs into the lungs every 6 (six) hours as needed., Disp: , Rfl:    Calcium Carb-Cholecalciferol (CALCIUM + D3 PO), Take by mouth every other day. PER PT TAKES TWO TABLESPOONS QOD, Disp: , Rfl:    ferrous sulfate (FER-IN-SOL) 75 (15 Fe) MG/ML SOLN, Take by mouth every other day. PER PT ONE OUNCE QOD, Disp: , Rfl:    fluticasone (FLOVENT HFA) 110 MCG/ACT inhaler, Inhale 2 puffs into the lungs 2 (two) times daily., Disp: , Rfl:     HYDROcodone-acetaminophen (NORCO/VICODIN) 5-325 MG tablet, Take 1 tablet by mouth every 6 (six) hours as needed for moderate pain., Disp: 10 tablet, Rfl: 0   ibuprofen (ADVIL) 800 MG tablet, Take 1 tablet (800 mg total) by mouth every 8 (eight) hours as needed., Disp: 30 tablet, Rfl: 0   metroNIDAZOLE (FLAGYL) 500 MG tablet, Take 1 tablet (500 mg total) by mouth 2 (two) times daily., Disp: 14 tablet, Rfl: 0   mirtazapine (REMERON) 7.5 MG tablet, Take 1 tablet (7.5 mg total) by mouth at bedtime., Disp: 90 tablet, Rfl: 1   pantoprazole (PROTONIX) 40 MG tablet, Take 40 mg by mouth daily., Disp: , Rfl:    Review of Systems:   ROS  Vitals:   There were no vitals filed for this visit.   There is no height or weight on file to calculate BMI.  Physical Exam:   Physical Exam  Assessment and Plan:   @DIAGLIST @     ***  Inda Coke, PA-C

## 2021-01-05 ENCOUNTER — Ambulatory Visit: Payer: BC Managed Care – PPO | Admitting: Physician Assistant

## 2021-01-11 ENCOUNTER — Other Ambulatory Visit: Payer: Self-pay

## 2021-01-11 ENCOUNTER — Encounter: Payer: Self-pay | Admitting: Physician Assistant

## 2021-01-12 ENCOUNTER — Encounter (HOSPITAL_COMMUNITY): Payer: Self-pay

## 2021-01-12 ENCOUNTER — Ambulatory Visit (HOSPITAL_COMMUNITY)
Admission: EM | Admit: 2021-01-12 | Discharge: 2021-01-12 | Disposition: A | Discharge status: Home / Self Care | Payer: BC Managed Care – PPO | Attending: Family Medicine | Admitting: Family Medicine

## 2021-01-12 DIAGNOSIS — S61012A Laceration without foreign body of left thumb without damage to nail, initial encounter: Secondary | ICD-10-CM

## 2021-01-12 DIAGNOSIS — Z23 Encounter for immunization: Secondary | ICD-10-CM

## 2021-01-12 MED ORDER — TETANUS-DIPHTH-ACELL PERTUSSIS 5-2.5-18.5 LF-MCG/0.5 IM SUSY
PREFILLED_SYRINGE | INTRAMUSCULAR | Status: AC
  Filled 2021-01-12: qty 0.5

## 2021-01-12 MED ORDER — TETANUS-DIPHTH-ACELL PERTUSSIS 5-2.5-18.5 LF-MCG/0.5 IM SUSY
0.5000 mL | PREFILLED_SYRINGE | Freq: Once | INTRAMUSCULAR | Status: AC
  Administered 2021-01-12: 0.5 mL via INTRAMUSCULAR

## 2021-01-12 NOTE — ED Provider Notes (Signed)
  [facility]   [id number] 01/12/21 Arrival Time: 6073  ASSESSMENT & PLAN:  1. Laceration of left thumb without foreign body without damage to nail, initial encounter     Meds ordered this encounter  Medications   Tdap (BOOSTRIX) injection 0.5 mL   Steri-strips applied to approximate wound edges. Bandaged. She has thumb splint at home to wear as needed.   Reviewed expectations re: course of current medical issues. Questions answered. Outlined signs and symptoms indicating need for more acute intervention. Patient verbalized understanding. After Visit Summary given.   SUBJECTIVE:  Erika Frazier is a 40 y.o. female who presents with a laceration of her L thumb; knife cut; approx 16 hours ago; occas bleeding. No extremity sensation changes or weakness.   Td UTD: No.   OBJECTIVE:  Vitals:   01/12/21 1047 01/12/21 1049  BP:  134/90  Pulse: 62   Resp: 17   Temp: 98.9 F (37.2 C)   TempSrc: Oral   SpO2: 99%      General appearance: alert; no distress L thumb with approx 1 cm superficial laceration; dorsal; no active bleeding; FROM; normal cap refill and distal sensation Psychological: alert and cooperative; normal mood and affect   No Known Allergies  Past Medical History:  Diagnosis Date   Fibroids, subserous    GERD (gastroesophageal reflux disease)    History of ovarian cyst    IDA (iron deficiency anemia)    Mild persistent asthma    Wears glasses    Social History   Socioeconomic History   Marital status: Significant Other    Spouse name: Not on file   Number of children: Not on file   Years of education: Not on file   Highest education level: Not on file  Occupational History   Not on file  Tobacco Use   Smoking status: Former    Years: 5.00    Types: Cigarettes    Quit date: 2010    Years since quitting: 12.8   Smokeless tobacco: Never  Vaping Use   Vaping Use: Never used  Substance and Sexual Activity   Alcohol use: Not  Currently    Comment: occasional   Drug use: Yes    Types: Marijuana    Comment: 10-08-2020  per pt smokes average 3 times per week   Sexual activity: Yes    Birth control/protection: Condom  Other Topics Concern   Not on file  Social History Narrative   Lives with boyfriend   Social Determinants of Health   Financial Resource Strain: Not on file  Food Insecurity: Not on file  Transportation Needs: Not on file  Physical Activity: Not on file  Stress: Not on file  Social Connections: Not on file          [place], MD 01/12/21 1517

## 2021-01-12 NOTE — ED Triage Notes (Signed)
Pt presents with a laceration to the L thumb. States she cut it with a sharp knife while cutting pineapple yesterday. Pt states it is time for a Tdap shot.

## 2021-01-19 ENCOUNTER — Other Ambulatory Visit: Payer: Self-pay | Admitting: *Deleted

## 2021-01-19 MED ORDER — MIRTAZAPINE 7.5 MG PO TABS: 7.5000 mg | ORAL_TABLET | Freq: Every day | ORAL | 0 refills | Status: DC

## 2021-01-27 ENCOUNTER — Ambulatory Visit (INDEPENDENT_AMBULATORY_CARE_PROVIDER_SITE_OTHER): Payer: BC Managed Care – PPO

## 2021-01-27 ENCOUNTER — Other Ambulatory Visit: Payer: Self-pay

## 2021-01-27 ENCOUNTER — Ambulatory Visit (INDEPENDENT_AMBULATORY_CARE_PROVIDER_SITE_OTHER): Payer: BC Managed Care – PPO | Admitting: Obstetrics & Gynecology

## 2021-01-27 VITALS — BP 136/88 | HR 77 | Ht 62.0 in | Wt 136.0 lb

## 2021-01-27 DIAGNOSIS — D509 Iron deficiency anemia, unspecified: Secondary | ICD-10-CM

## 2021-01-27 DIAGNOSIS — D252 Subserosal leiomyoma of uterus: Secondary | ICD-10-CM | POA: Diagnosis not present

## 2021-01-27 MED ORDER — MISOPROSTOL 200 MCG PO TABS: ORAL_TABLET | ORAL | 0 refills | Status: DC

## 2021-01-30 ENCOUNTER — Encounter (HOSPITAL_BASED_OUTPATIENT_CLINIC_OR_DEPARTMENT_OTHER): Payer: Self-pay | Admitting: Obstetrics & Gynecology

## 2021-01-30 NOTE — Progress Notes (Signed)
GYNECOLOGY  VISIT  CC:   discuss ultrasound and proceeding with Sonata procedure  HPI: 40 y.o. G0P0000 Significant Other Black or [place] American female here for discussion of ultrasound and to discuss proceeding with Sonata procedure.  Ultrasound continues to show fibroids.  5 were noted today with two measuring 3 cms.  The others are small.  Pt is still desirous of a Sonata procedure before the end of the year if possible.    Procedure reviewed with pt again and risks discussed including Recovery and pain management discussed.  Risks including but not limited to bleeding, rare risk of transfusion, infection, 1% risk of uterine perforation with risks of fluid deficit causing cardiac arrythmia, cerebral swelling and/or need to stop procedure early.  Fluid emboli and rare risk of death discussed.  DVT/PE, rare risk of risk of bowel/bladder/ureteral/vascular injury.  Also, future fibroid growth with need for additional procedures are also risks.  Patient aware if pathology abnormal she may need additional treatment.  All questions answered.   She is aware this procedure is not FDA approved for women desirous of future child bearing.  She is not actively trying but does not want a hysterectomy or permanent contraception at this time.   Patient Active Problem List   Diagnosis Date Noted   Intramural uterine fibroid    Menorrhagia with regular cycle    Cervical stenosis (uterine cervix)    Fibroids, subserous 02/12/2020   Adjustment disorder with depressed mood 10/04/2019   Mild persistent asthma without complication 97/35/3299   Iron deficiency anemia 05/13/2013    Past Medical History:  Diagnosis Date   Fibroids, subserous    GERD (gastroesophageal reflux disease)    History of ovarian cyst    IDA (iron deficiency anemia)    Mild persistent asthma    Wears glasses     Past Surgical History:  Procedure Laterality Date   COLONOSCOPY  08/20/2020   @ [facility]    ESOPHAGOGASTRODUODENOSCOPY  01/07/2020   @ [facility]   HYSTEROSCOPY N/A 10/13/2020   Procedure: HYSTEROSCOPY, DILATION AND CURETTAGE;  Surgeon: Megan Salon, MD;  Location: [facility];  Service: Gynecology;  Laterality: N/A;    MEDS:   Current Outpatient Medications on File Prior to Visit  Medication Sig Dispense Refill   albuterol (VENTOLIN HFA) 108 (90 Base) MCG/ACT inhaler Inhale 2 puffs into the lungs every 6 (six) hours as needed.     buPROPion (WELLBUTRIN XL) 150 MG 24 hr tablet Take 150 mg by mouth daily.     Calcium Carb-Cholecalciferol (CALCIUM + D3 PO) Take by mouth every other day. PER PT TAKES TWO TABLESPOONS QOD     ferrous sulfate (FER-IN-SOL) 75 (15 Fe) MG/ML SOLN Take by mouth every other day. PER PT ONE OUNCE QOD     fluticasone (FLOVENT HFA) 110 MCG/ACT inhaler Inhale 2 puffs into the lungs 2 (two) times daily.     ibuprofen (ADVIL) 800 MG tablet Take 1 tablet (800 mg total) by mouth every 8 (eight) hours as needed. 30 tablet 0   mirtazapine (REMERON) 7.5 MG tablet Take 1 tablet (7.5 mg total) by mouth at bedtime. 90 tablet 0   pantoprazole (PROTONIX) 40 MG tablet Take 40 mg by mouth daily.     No current facility-administered medications on file prior to visit.    ALLERGIES: Patient has no known allergies.  Family History  Problem Relation Age of Onset   Hypertension Mother    Alcohol abuse Father  now in remission   Lupus Maternal Aunt    Heart disease Maternal Aunt     SH:  single, non smoker  Review of Systems  Genitourinary:        Menorrhagia   PHYSICAL EXAMINATION:    BP 136/88   Pulse 77   Ht 5\' 2"  (1.575 m)   Wt 136 lb (61.7 kg)   LMP 01/15/2021   BMI 24.87 kg/m     Physical Exam Constitutional:      Appearance: Normal appearance.  Skin:    General: Skin is warm.  Neurological:     General: No focal deficit present.     Mental Status: She is alert.  Psychiatric:        Mood and Affect: Mood normal.     Assessment/Plan: 1. Fibroids, subserous - will proceed with scheduling Sonata procedure at this time  2. Iron deficiency anemia, unspecified iron deficiency anemia type - pt is still on every other day iron and will continue   Total time with pt:  22 minutes

## 2021-01-31 ENCOUNTER — Encounter (HOSPITAL_BASED_OUTPATIENT_CLINIC_OR_DEPARTMENT_OTHER): Payer: Self-pay | Admitting: Obstetrics & Gynecology

## 2021-02-08 ENCOUNTER — Encounter (HOSPITAL_BASED_OUTPATIENT_CLINIC_OR_DEPARTMENT_OTHER): Payer: Self-pay | Admitting: Obstetrics & Gynecology

## 2021-02-12 ENCOUNTER — Other Ambulatory Visit (HOSPITAL_BASED_OUTPATIENT_CLINIC_OR_DEPARTMENT_OTHER): Payer: Self-pay | Admitting: Obstetrics & Gynecology

## 2021-02-12 ENCOUNTER — Encounter (HOSPITAL_BASED_OUTPATIENT_CLINIC_OR_DEPARTMENT_OTHER): Payer: Self-pay | Admitting: Obstetrics & Gynecology

## 2021-02-12 ENCOUNTER — Other Ambulatory Visit: Payer: Self-pay

## 2021-02-12 DIAGNOSIS — N920 Excessive and frequent menstruation with regular cycle: Secondary | ICD-10-CM

## 2021-02-12 NOTE — Progress Notes (Signed)
Spoke w/ via phone for pre-op interview--- pt Lab needs dos---- cbc, t&s, urine preg              Lab results------ no COVID test -----patient states asymptomatic no test needed Arrive at ------- 0530 on 02-16-2021 NPO after MN NO Solid Food.  Clear liquids from MN until--- 0430 Med rec completed Medications to take morning of surgery ----- wellbutrin, protonix, flovent inhaler Diabetic medication ----- Patient instructed no nail polish to be worn day of surgery Patient instructed to bring photo id and insurance card day of surgery Patient aware to have Driver (ride ) / caregiver for 24 hours after surgery --sig other, howard burch Patient Special Instructions ----- asked pt to bring rescue inhaler dos and advised pt due to her asthma to start using flovent as prescribed twice daily today until morning of surgery Pre-Op special Istructions ----- n/a Patient verbalized understanding of instructions that were given at this phone interview. Patient denies shortness of breath, chest pain, fever, cough at this phone interview.

## 2021-02-15 NOTE — Anesthesia Preprocedure Evaluation (Addendum)
Anesthesia Evaluation  Patient identified by MRN, date of birth, ID band Patient awake    Reviewed: Allergy & Precautions, NPO status , Patient's Chart, lab work & pertinent test results  History of Anesthesia Complications Negative for: history of anesthetic complications  Airway Mallampati: I  TM Distance: >3 FB Neck ROM: Full    Dental  (+) Dental Advisory Given, Teeth Intact   Pulmonary asthma , former smoker,    Pulmonary exam normal        Cardiovascular negative cardio ROS Normal cardiovascular exam     Neuro/Psych negative neurological ROS  negative psych ROS   GI/Hepatic Neg liver ROS, GERD  Medicated and Controlled,  Endo/Other  negative endocrine ROS  Renal/GU negative Renal ROS     Musculoskeletal negative musculoskeletal ROS (+)   Abdominal   Peds  Hematology negative hematology ROS (+)   Anesthesia Other Findings   Reproductive/Obstetrics  Fibroids                             Anesthesia Physical Anesthesia Plan  ASA: 2  Anesthesia Plan: General   Post-op Pain Management: Tylenol PO (pre-op)   Induction: Intravenous  PONV Risk Score and Plan: 3 and Treatment may vary due to age or medical condition, Ondansetron, Dexamethasone and Midazolam  Airway Management Planned: LMA  Additional Equipment: None  Intra-op Plan:   Post-operative Plan: Extubation in OR  Informed Consent: I have reviewed the patients History and Physical, chart, labs and discussed the procedure including the risks, benefits and alternatives for the proposed anesthesia with the patient or authorized representative who has indicated his/her understanding and acceptance.     Dental advisory given  Plan Discussed with: CRNA and Anesthesiologist  Anesthesia Plan Comments:        Anesthesia Quick Evaluation

## 2021-02-16 ENCOUNTER — Encounter (HOSPITAL_BASED_OUTPATIENT_CLINIC_OR_DEPARTMENT_OTHER)
Admission: RE | Disposition: A | Discharge status: Home / Self Care | Payer: Self-pay | Source: Home / Self Care | Attending: Obstetrics & Gynecology

## 2021-02-16 ENCOUNTER — Encounter (HOSPITAL_BASED_OUTPATIENT_CLINIC_OR_DEPARTMENT_OTHER): Payer: Self-pay | Admitting: Obstetrics & Gynecology

## 2021-02-16 ENCOUNTER — Ambulatory Visit (HOSPITAL_BASED_OUTPATIENT_CLINIC_OR_DEPARTMENT_OTHER)
Admission: RE | Admit: 2021-02-16 | Discharge: 2021-02-16 | Disposition: A | Discharge status: Home / Self Care | Payer: BC Managed Care – PPO | Attending: Obstetrics & Gynecology | Admitting: Obstetrics & Gynecology

## 2021-02-16 ENCOUNTER — Other Ambulatory Visit: Payer: Self-pay

## 2021-02-16 ENCOUNTER — Ambulatory Visit (HOSPITAL_BASED_OUTPATIENT_CLINIC_OR_DEPARTMENT_OTHER): Payer: BC Managed Care – PPO | Admitting: Anesthesiology

## 2021-02-16 DIAGNOSIS — D252 Subserosal leiomyoma of uterus: Secondary | ICD-10-CM | POA: Diagnosis not present

## 2021-02-16 DIAGNOSIS — D500 Iron deficiency anemia secondary to blood loss (chronic): Secondary | ICD-10-CM

## 2021-02-16 DIAGNOSIS — N889 Noninflammatory disorder of cervix uteri, unspecified: Secondary | ICD-10-CM

## 2021-02-16 DIAGNOSIS — N882 Stricture and stenosis of cervix uteri: Secondary | ICD-10-CM | POA: Insufficient documentation

## 2021-02-16 DIAGNOSIS — N920 Excessive and frequent menstruation with regular cycle: Secondary | ICD-10-CM | POA: Insufficient documentation

## 2021-02-16 DIAGNOSIS — D251 Intramural leiomyoma of uterus: Secondary | ICD-10-CM | POA: Diagnosis not present

## 2021-02-16 DIAGNOSIS — D649 Anemia, unspecified: Secondary | ICD-10-CM | POA: Diagnosis not present

## 2021-02-16 DIAGNOSIS — D509 Iron deficiency anemia, unspecified: Secondary | ICD-10-CM | POA: Diagnosis not present

## 2021-02-16 LAB — CBC
HCT: 38.2 % (ref 36.0–46.0)
Hemoglobin: 12 g/dL (ref 12.0–15.0)
MCH: 24.1 pg — ABNORMAL LOW (ref 26.0–34.0)
MCHC: 31.4 g/dL (ref 30.0–36.0)
MCV: 76.9 fL — ABNORMAL LOW (ref 80.0–100.0)
Platelets: 424 10*3/uL — ABNORMAL HIGH (ref 150–400)
RBC: 4.97 MIL/uL (ref 3.87–5.11)
RDW: 15 % (ref 11.5–15.5)
WBC: 5.4 10*3/uL (ref 4.0–10.5)
nRBC: 0 % (ref 0.0–0.2)

## 2021-02-16 LAB — TYPE AND SCREEN
ABO/RH(D): B POS
Antibody Screen: NEGATIVE

## 2021-02-16 LAB — POCT PREGNANCY, URINE: Preg Test, Ur: NEGATIVE

## 2021-02-16 LAB — ABO/RH: ABO/RH(D): B POS

## 2021-02-16 SURGERY — RADIOFREQUENCY ABLATION, LEIOMYOMA, UTERUS, TRANSCERVICAL APPROACH, WITH US GUIDANCE
Anesthesia: General | Site: Vagina

## 2021-02-16 MED ORDER — STERILE WATER FOR IRRIGATION IR SOLN
Status: DC | PRN
  Administered 2021-02-16: 500 mL

## 2021-02-16 MED ORDER — ACETAMINOPHEN 500 MG PO TABS
1000.0000 mg | ORAL_TABLET | Freq: Once | ORAL | Status: AC
  Administered 2021-02-16: 06:00:00 1000 mg via ORAL

## 2021-02-16 MED ORDER — KETOROLAC TROMETHAMINE 30 MG/ML IJ SOLN
INTRAMUSCULAR | Status: DC | PRN
  Administered 2021-02-16: 30 mg via INTRAVENOUS

## 2021-02-16 MED ORDER — ACETAMINOPHEN 500 MG PO TABS
ORAL_TABLET | ORAL | Status: AC
  Filled 2021-02-16: qty 2

## 2021-02-16 MED ORDER — FENTANYL CITRATE (PF) 100 MCG/2ML IJ SOLN
INTRAMUSCULAR | Status: AC
  Filled 2021-02-16: qty 2

## 2021-02-16 MED ORDER — LIDOCAINE 2% (20 MG/ML) 5 ML SYRINGE
INTRAMUSCULAR | Status: AC
  Filled 2021-02-16: qty 5

## 2021-02-16 MED ORDER — OXYCODONE HCL 5 MG/5ML PO SOLN: 5.0000 mg | Freq: Once | ORAL | Status: DC | PRN

## 2021-02-16 MED ORDER — LIDOCAINE-EPINEPHRINE 1 %-1:100000 IJ SOLN
INTRAMUSCULAR | Status: DC | PRN
  Administered 2021-02-16: 10 mL

## 2021-02-16 MED ORDER — DEXAMETHASONE SODIUM PHOSPHATE 4 MG/ML IJ SOLN
INTRAMUSCULAR | Status: DC | PRN
  Administered 2021-02-16: 10 mg via INTRAVENOUS

## 2021-02-16 MED ORDER — PROPOFOL 10 MG/ML IV BOLUS
INTRAVENOUS | Status: AC
  Filled 2021-02-16: qty 20

## 2021-02-16 MED ORDER — MIDAZOLAM HCL 5 MG/5ML IJ SOLN
INTRAMUSCULAR | Status: DC | PRN
  Administered 2021-02-16: 2 mg via INTRAVENOUS

## 2021-02-16 MED ORDER — SODIUM CHLORIDE 0.9 % IR SOLN
Status: DC | PRN
  Administered 2021-02-16: 600 mL via INTRAVESICAL

## 2021-02-16 MED ORDER — DEXAMETHASONE SODIUM PHOSPHATE 10 MG/ML IJ SOLN
INTRAMUSCULAR | Status: AC
  Filled 2021-02-16: qty 1

## 2021-02-16 MED ORDER — PROPOFOL 10 MG/ML IV BOLUS
INTRAVENOUS | Status: DC | PRN
  Administered 2021-02-16: 200 mg via INTRAVENOUS
  Administered 2021-02-16: 50 mg via INTRAVENOUS

## 2021-02-16 MED ORDER — PROMETHAZINE HCL 25 MG/ML IJ SOLN: 6.2500 mg | INTRAMUSCULAR | Status: DC | PRN

## 2021-02-16 MED ORDER — LIDOCAINE 2% (20 MG/ML) 5 ML SYRINGE
INTRAMUSCULAR | Status: DC | PRN
  Administered 2021-02-16: 60 mg via INTRAVENOUS

## 2021-02-16 MED ORDER — FENTANYL CITRATE (PF) 100 MCG/2ML IJ SOLN
INTRAMUSCULAR | Status: DC | PRN
  Administered 2021-02-16 (×3): 25 ug via INTRAVENOUS
  Administered 2021-02-16: 50 ug via INTRAVENOUS
  Administered 2021-02-16 (×3): 25 ug via INTRAVENOUS

## 2021-02-16 MED ORDER — MIDAZOLAM HCL 2 MG/2ML IJ SOLN
INTRAMUSCULAR | Status: AC
  Filled 2021-02-16: qty 2

## 2021-02-16 MED ORDER — POVIDONE-IODINE 10 % EX SWAB: 2.0000 "application " | Freq: Once | CUTANEOUS | Status: DC

## 2021-02-16 MED ORDER — ONDANSETRON HCL 4 MG/2ML IJ SOLN
INTRAMUSCULAR | Status: DC | PRN
  Administered 2021-02-16: 4 mg via INTRAVENOUS

## 2021-02-16 MED ORDER — FENTANYL CITRATE (PF) 100 MCG/2ML IJ SOLN
25.0000 ug | INTRAMUSCULAR | Status: DC | PRN
  Administered 2021-02-16: 10:00:00 25 ug via INTRAVENOUS
  Administered 2021-02-16: 10:00:00 50 ug via INTRAVENOUS

## 2021-02-16 MED ORDER — OXYCODONE HCL 5 MG PO TABS: 5.0000 mg | ORAL_TABLET | Freq: Once | ORAL | Status: DC | PRN

## 2021-02-16 MED ORDER — LACTATED RINGERS IV SOLN: INTRAVENOUS | Status: DC

## 2021-02-16 MED ORDER — KETOROLAC TROMETHAMINE 30 MG/ML IJ SOLN
INTRAMUSCULAR | Status: AC
  Filled 2021-02-16: qty 1

## 2021-02-16 MED ORDER — ONDANSETRON HCL 4 MG/2ML IJ SOLN
INTRAMUSCULAR | Status: AC
  Filled 2021-02-16: qty 2

## 2021-02-16 SURGICAL SUPPLY — 21 items
BIPOLAR CUTTING LOOP 21FR (ELECTRODE)
CATH ROBINSON RED A/P 16FR (CATHETERS) ×3 IMPLANT
DEVICE MYOSURE LITE (MISCELLANEOUS) IMPLANT
DEVICE MYOSURE REACH (MISCELLANEOUS) IMPLANT
DILATOR CANAL MILEX (MISCELLANEOUS) ×2 IMPLANT
ELECT DISPERSIVE SONATA (ELECTRODE) ×6 IMPLANT
GAUZE 4X4 16PLY ~~LOC~~+RFID DBL (SPONGE) ×4 IMPLANT
GLOVE SURG LTX SZ6.5 (GLOVE) ×6 IMPLANT
GLOVE SURG UNDER POLY LF SZ7 (GLOVE) ×3 IMPLANT
GOWN STRL REUS W/TWL LRG LVL3 (GOWN DISPOSABLE) ×6 IMPLANT
HANDPIECE RFA SONATA (MISCELLANEOUS) ×3 IMPLANT
IV NS IRRIG 3000ML ARTHROMATIC (IV SOLUTION) ×3 IMPLANT
KIT PROCEDURE FLUENT (KITS) ×3 IMPLANT
KIT TURNOVER CYSTO (KITS) ×3 IMPLANT
LOOP CUTTING BIPOLAR 21FR (ELECTRODE) IMPLANT
PACK VAGINAL MINOR WOMEN LF (CUSTOM PROCEDURE TRAY) ×3 IMPLANT
PAD OB MATERNITY 4.3X12.25 (PERSONAL CARE ITEMS) ×3 IMPLANT
SEAL CERVICAL OMNI LOK (ABLATOR) IMPLANT
SEAL ROD LENS SCOPE MYOSURE (ABLATOR) ×2 IMPLANT
TOWEL OR 17X26 10 PK STRL BLUE (TOWEL DISPOSABLE) ×3 IMPLANT
WATER STERILE IRR 500ML POUR (IV SOLUTION) ×3 IMPLANT

## 2021-02-16 NOTE — Anesthesia Procedure Notes (Signed)
Procedure Name: LMA Insertion Date/Time: 02/16/2021 7:46 AM Performed by: Justice Rocher, CRNA Pre-anesthesia Checklist: Patient identified, Emergency Drugs available, Suction available, Patient being monitored and Timeout performed Patient Re-evaluated:Patient Re-evaluated prior to induction Oxygen Delivery Method: Circle system utilized Preoxygenation: Pre-oxygenation with 100% oxygen Induction Type: IV induction Ventilation: Mask ventilation without difficulty LMA: LMA inserted LMA Size: 4.0 Number of attempts: 1 Airway Equipment and Method: Bite block Placement Confirmation: positive ETCO2, breath sounds checked- equal and bilateral and CO2 detector Tube secured with: Tape Dental Injury: Teeth and Oropharynx as per pre-operative assessment

## 2021-02-16 NOTE — H&P (Signed)
Erika Frazier is an 40 y.o. female G73 SAAF here for sonata radiofrequency ablation of uterine fibroids to treat menorrhagia and mild anemia.  Pt has 5 fibroids, 3 measuring 3cm.  She had and attempted procedure earlier this year but this was stopped as I could not get the cervix dilated enough to pass the instrument for the procedure. She was seen on 01/27/2021 and risks/benefits, alternatives were removed.  Pt really does want to keep her uterus if at all possible.  She is aware myomectomy, [place], and hysterectomy are other options if this fails today.  Pertinent Gynecological History: Menses: regular but heavy Contraception: condoms DES exposure: denies Blood transfusions: none Sexually transmitted diseases: no past history Previous GYN Procedures:  failed sonata procedure 09/2020   Last mammogram:  n/a Last pap: normal Date: 03/18/2020 OB History: G0, P0   Menstrual History: Patient's last menstrual period was 02/12/2021 (exact date).    Past Medical History:  Diagnosis Date   Fibroids, subserous    GERD (gastroesophageal reflux disease)    History of ovarian cyst    IDA (iron deficiency anemia)    Mild persistent asthma    Wears glasses     Past Surgical History:  Procedure Laterality Date   COLONOSCOPY  08/20/2020   @ [facility]   ESOPHAGOGASTRODUODENOSCOPY  01/07/2020   @ [facility]   HYSTEROSCOPY N/A 10/13/2020   Procedure: HYSTEROSCOPY, DILATION AND CURETTAGE;  Surgeon: Megan Salon, MD;  Location: [facility];  Service: Gynecology;  Laterality: N/A;    Family History  Problem Relation Age of Onset   Hypertension Mother    Alcohol abuse Father        now in remission   Lupus Maternal Aunt    Heart disease Maternal Aunt     Social History:  reports that she quit smoking about 12 years ago. Her smoking use included cigarettes. She has never used smokeless tobacco. She reports that she does not currently use alcohol. She reports  current drug use. Drug: Marijuana.  Allergies: No Known Allergies  Medications Prior to Admission  Medication Sig Dispense Refill Last Dose   albuterol (VENTOLIN HFA) 108 (90 Base) MCG/ACT inhaler Inhale 2 puffs into the lungs every 6 (six) hours as needed.   02/15/2021 at 2030   buPROPion (WELLBUTRIN XL) 150 MG 24 hr tablet Take 150 mg by mouth daily.   02/15/2021 at 0600   Calcium Carb-Cholecalciferol (CALCIUM + D3 PO) Take by mouth every other day. PER PT TAKES TWO TABLESPOONS QOD   02/15/2021   DM-Phenylephrine-Acetaminophen (VICKS DAYQUIL COLD & FLU) 10-5-325 MG CAPS Take 1 capsule by mouth.   02/16/2021 at 0600   ferrous sulfate (FER-IN-SOL) 75 (15 Fe) MG/ML SOLN Take by mouth every other day. PER PT ONE OUNCE QOD   02/15/2021   fluticasone (FLOVENT HFA) 110 MCG/ACT inhaler Inhale 2 puffs into the lungs 2 (two) times daily as needed. Prescribed twice daily but pt is only using as needed   02/15/2021 at 2030   mirtazapine (REMERON) 7.5 MG tablet Take 1 tablet (7.5 mg total) by mouth at bedtime. 90 tablet 0 02/14/2021   misoprostol (CYTOTEC) 200 MCG tablet Place 1 tablet vaginally the night before surgery and again the morning of surgery. 2 tablet 0 02/16/2021 at 0500   pantoprazole (PROTONIX) 40 MG tablet Take 40 mg by mouth daily.   02/16/2021 at 0500   ibuprofen (ADVIL) 800 MG tablet Take 1 tablet (800 mg total) by mouth every  8 (eight) hours as needed. 30 tablet 0 More than a month    Review of Systems  All other systems reviewed and are negative.  Blood pressure (!) 144/98, pulse 80, temperature 98.8 F (37.1 C), temperature source Oral, resp. rate 17, height 5\' 2"  (1.575 m), weight 64.2 kg, last menstrual period 02/12/2021, SpO2 100 %. Physical Exam Constitutional:      Appearance: Normal appearance.  Cardiovascular:     Rate and Rhythm: Normal rate and regular rhythm.  Pulmonary:     Effort: Pulmonary effort is normal.     Breath sounds: Normal breath sounds.  Neurological:      General: No focal deficit present.     Mental Status: She is alert.  Psychiatric:        Mood and Affect: Mood normal.    Results for orders placed or performed during the hospital encounter of 02/16/21 (from the past 24 hour(s))  Pregnancy, urine POC     Status: None   Collection Time: 02/16/21  5:40 AM  Result Value Ref Range   Preg Test, Ur NEGATIVE NEGATIVE  Type and screen     Status: None (Preliminary result)   Collection Time: 02/16/21  6:15 AM  Result Value Ref Range   ABO/RH(D) PENDING    Antibody Screen PENDING    Sample Expiration      02/19/2021,2359 Performed at [facility], [place] [address] [place], [place] [address]   CBC     Status: Abnormal   Collection Time: 02/16/21  6:15 AM  Result Value Ref Range   WBC 5.4 4.0 - 10.5 K/uL   RBC 4.97 3.87 - 5.11 MIL/uL   Hemoglobin 12.0 12.0 - 15.0 g/dL   HCT 38.2 36.0 - 46.0 %   MCV 76.9 (L) 80.0 - 100.0 fL   MCH 24.1 (L) 26.0 - 34.0 pg   MCHC 31.4 30.0 - 36.0 g/dL   RDW 15.0 11.5 - 15.5 %   Platelets 424 (H) 150 - 400 K/uL   nRBC 0.0 0.0 - 0.2 %    No results found.  Assessment/Plan: 40 yo G0 SAAF with menorrhagia, fibroids here for [facility] RFA treatment.  Procedure reviewed. Questions answered.  Pt ready to proceed.  Megan Salon 02/16/2021, 7:20 AM

## 2021-02-16 NOTE — Anesthesia Postprocedure Evaluation (Signed)
Anesthesia Post Note  Patient: Erika Frazier  Procedure(s) Performed: Radio Frequency Ablation with Sonata; Dilation and Hysteroscopy  (Vagina )     Patient location during evaluation: PACU Anesthesia Type: General Level of consciousness: awake and alert Pain management: pain level controlled Vital Signs Assessment: post-procedure vital signs reviewed and stable Respiratory status: spontaneous breathing, nonlabored ventilation and respiratory function stable Cardiovascular status: stable and blood pressure returned to baseline Anesthetic complications: no   No notable events documented.  Last Vitals:  Vitals:   02/16/21 1000 02/16/21 1020  BP: (!) 149/90 (!) 135/100  Pulse:  82  Resp: 12 15  Temp: 37.4 C   SpO2: 99% 99%    Last Pain:  Vitals:   02/16/21 1020  TempSrc:   PainSc: [place]

## 2021-02-16 NOTE — Transfer of Care (Signed)
Immediate Anesthesia Transfer of Care Note  Patient: Erika Frazier  Procedure(s) Performed: Procedure(s) (LRB): Radio Frequency Ablation with Sonata; Dilation and Hysteroscopy  (N/A)  Patient Location: PACU  Anesthesia Type: General  Level of Consciousness: awake, sedated, patient cooperative and responds to stimulation  Airway & Oxygen Therapy: Patient Spontanous Breathing and Patient on RA with soft FM   Post-op Assessment: Report given to PACU RN, Post -op Vital signs reviewed and stable and Patient moving all extremities  Post vital signs: Reviewed and stable  Complications: No apparent anesthesia complications

## 2021-02-16 NOTE — Discharge Instructions (Addendum)
Post-surgical Instructions, Outpatient Surgery  You may expect to feel dizzy, weak, and drowsy for as long as 24 hours after receiving the medicine that made you sleep (anesthetic). For the first 24 hours after your surgery:   Do not drive a car, ride a bicycle, participate in physical activities, or take public transportation until you are done taking narcotic pain medicines or as directed by Dr. Miller.  Do not drink alcohol or take tranquilizers.  Do not take medicine that has not been prescribed by your physicians.  Do not sign important papers or make important decisions while on narcotic pain medicines.  Have a responsible person with you.   PAIN MANAGEMENT Motrin 800mg.  (This is the same as 4-200mg over the counter tablets of Motrin or ibuprofen.)  You may take this every eight hours or as needed for cramping.   Vicodin 5/325mg.  For more severe pain, take one or two tablets every four to six hours as needed for pain control.  (Remember that narcotic pain medications increase your risk of constipation.  If this becomes a problem, you may take an over the counter stool softener like Colace 100mg up to four times a day.)  DO'S AND DON'T'S Do not take a tub bath for one week.  You may shower on the first day after your surgery Do not do any heavy lifting for one to two weeks.  This increases the chance of bleeding. Do move around as you feel able.  Stairs are fine.  You may begin to exercise again as you feel able.  Do not lift any weights for two weeks. Do not put anything in the vagina for two weeks--no tampons, intercourse, or douching.    REGULAR MEDIATIONS/VITAMINS: You may restart all of your regular medications as prescribed. You may restart all of your vitamins as you normally take them.    PLEASE CALL OR SEEK MEDICAL CARE IF: You have persistent nausea and vomiting.  You have trouble eating or drinking.  You have an oral temperature above 100.5.  You have constipation that is  not helped by adjusting diet or increasing fluid intake. Pain medicines are a common cause of constipation.  You have heavy vaginal bleeding   Post Anesthesia Home Care Instructions  Activity: Get plenty of rest for the remainder of the day. A responsible individual must stay with you for 24 hours following the procedure.  For the next 24 hours, DO NOT: -Drive a car -Operate machinery -Drink alcoholic beverages -Take any medication unless instructed by your physician -Make any legal decisions or sign important papers.  Meals: Start with liquid foods such as gelatin or soup. Progress to regular foods as tolerated. Avoid greasy, spicy, heavy foods. If nausea and/or vomiting occur, drink only clear liquids until the nausea and/or vomiting subsides. Call your physician if vomiting continues.  Special Instructions/Symptoms: Your throat may feel dry or sore from the anesthesia or the breathing tube placed in your throat during surgery. If this causes discomfort, gargle with warm salt water. The discomfort should disappear within 24 hours.       

## 2021-02-16 NOTE — Op Note (Addendum)
02/16/2021  9:24 AM  PATIENT:  Erika Frazier  40 y.o. female  PRE-OPERATIVE DIAGNOSIS:  menorrhagia with regular cycle, intramural fibroids,h/o iron deficiency anemia, failed Sonata procedure earlier this year due to difficulty with dilating the cervix due to cervical stenosis and atypical angle of cervix  POST-OPERATIVE DIAGNOSIS:  menorrhagia with regular cycle, intramural fibroids,h/o iron deficiency anemia  PROCEDURE:  Procedure(s): Radio Frequency Ablation with Sonata; Dilation and Hysteroscopy   SURGEON:  Megan Salon  ASSISTANTS: OR staff  ANESTHESIA:   general  ESTIMATED BLOOD LOSS: 10 mL  BLOOD ADMINISTERED:none   FLUIDS: 1000cc LR  UOP: pt voided before going back to the operating room then 40cc with I&O cath in OR  SPECIMEN:  none  DISPOSITION OF SPECIMEN:  N/A  FINDINGS: intraoperative ultrasound showed 3 fibroids, 3.0cm, 3.5-4cm and 2cm  DESCRIPTION OF OPERATION: Patient was taken to the operating room.  She is placed in the supine position. SCDs were on her lower extremities and functioning properly. General anesthesia with an LMA was administered without difficulty. Dr. Fransisco Beau, anesthesia, oversaw case.   Legs were then placed in the [place] in the low lithotomy position. The legs were lifted to the high lithotomy position and the Betadine prep was used on the inner thighs perineum and vagina x3. Patient was draped in a normal standard fashion. An in and out catheterization with a red rubber Foley catheter was performed.  A bivalve speculum was placed the vagina. The anterior lip of the cervix was grasped with single-tooth tenaculum.  A paracervical block of 1% lidocaine mixed one-to-one with epinephrine (1:100,000 units).  10 cc was used total. The cervix is dilated up to #9 Hagar dilators. The endometrial cavity sounded to 8cm.  I had trouble dilating the pt's cervix when this was attempted earlier this year so to ensure I was dilating the endocervix and  within the uterine cavity, a hysteroscopy was performed about mid way through cervical dilation.      A Mysoure hysteroscope was obtained. Normal saline was used as a hysteroscopic fluid. The hysteroscope was advanced through the endocervical canal into the endometrial cavity. No intracavitary lesions were noted.  Correct dilation of cervix was noted.  The hysteroscope was removed. The fluid deficit was 0cc.     The sonata handpiece was then inserted into the endometrial cavity and a complete uterine survey was performed.  A mid posterior and fundal 3.5-4cm cm type II-V fibroid located around 6:00 was treated first.  Graphical overlay was placed to target the ablation zone.  The trocar tip was introduced.  Care was taken to ensure that the guide was within the serosal boundary.  The needle electrodes were deployed.  A safety check was again performed.  Radiofrequency ablation was performed initially for 3:36 minutes.  This ablation size was 3.3 x 2.4cm.  In a similar fashion a second ablation was performed of an ablation zone measuring 2.3 x 1.6cm.  this Ablation cycle lasted 1:44min.  Safety check was performed x 2 as with first ablation cycle.    A second 3.0cm type 4 fibroid was noted in the posterior fundus at 5 o'clock.  Using the same technique, two ablation cycles were performed for this fibroid.  The first measured 3.1 x 2.2cm and lasted 3:06 seconds.  The second was 2.9 x 2.2cm and lasted 2:54 seconds.  The last fibroid located left lateral and in the lower segment measured 2cm and was a type 3 fibroid.  This was ablated with a  2.0 x 1.3cm zone for 1:06 seconds.  The 3 fibroids treated appeared ablated with ultrasound guidance with out-gassing noted by the ultrasound appearance.    At this point I felt there was adequate ablation of these three fibroids.  No additional fibroids were noted for ablation.  The Sonata device was removed after the procedure was ended.  The tenaculum was removed from  the anterior lip of the cervix.  Speculum was reinserted.  Minimal bleeding was noted from the tenaculum site.  The prep was cleansed of the patient's skin. The legs are positioned back in the supine position. Sponge, lap, needle, initially counts were correct x2. Patient was taken to recovery in stable condition.  COUNTS:  YES  PLAN OF CARE: Transfer to PACU

## 2021-02-18 ENCOUNTER — Encounter (HOSPITAL_BASED_OUTPATIENT_CLINIC_OR_DEPARTMENT_OTHER): Payer: Self-pay | Admitting: Obstetrics & Gynecology

## 2021-02-18 ENCOUNTER — Encounter (HOSPITAL_BASED_OUTPATIENT_CLINIC_OR_DEPARTMENT_OTHER): Payer: Self-pay | Admitting: *Deleted

## 2021-03-13 ENCOUNTER — Other Ambulatory Visit: Payer: Self-pay | Admitting: Physician Assistant

## 2021-03-15 MED ORDER — PANTOPRAZOLE SODIUM 40 MG PO TBEC: 40.0000 mg | DELAYED_RELEASE_TABLET | Freq: Every day | ORAL | 0 refills | Status: DC

## 2021-03-29 ENCOUNTER — Encounter (HOSPITAL_BASED_OUTPATIENT_CLINIC_OR_DEPARTMENT_OTHER): Payer: Self-pay | Admitting: Obstetrics & Gynecology

## 2021-03-29 ENCOUNTER — Ambulatory Visit (INDEPENDENT_AMBULATORY_CARE_PROVIDER_SITE_OTHER): Payer: BC Managed Care – PPO | Admitting: Obstetrics & Gynecology

## 2021-03-29 ENCOUNTER — Other Ambulatory Visit: Payer: Self-pay

## 2021-03-29 VITALS — BP 132/86 | HR 72 | Ht 62.0 in | Wt 142.2 lb

## 2021-03-29 DIAGNOSIS — D509 Iron deficiency anemia, unspecified: Secondary | ICD-10-CM | POA: Diagnosis not present

## 2021-03-29 DIAGNOSIS — D252 Subserosal leiomyoma of uterus: Secondary | ICD-10-CM

## 2021-03-29 DIAGNOSIS — N920 Excessive and frequent menstruation with regular cycle: Secondary | ICD-10-CM | POA: Diagnosis not present

## 2021-03-29 NOTE — [place] Notes (Signed)
GYNECOLOGY  VISIT  CC:   Recheck after Sonata  HPI: 41 y.o. G0P0000 Significant Other Black or [place] American female here for follow up after Sonata procedure.  She is about six week post op.  She's been having spotting since the procedure.  She's one menstrual cycle since the procedure.  Pain was improved.  Flow was heavier.  She went back to work about 7 days after the procedure.  Feels she's having less pressure and pain/cramping.  Feeling good overall.  GYNECOLOGIC HISTORY: Patient's last menstrual period was 03/06/2021. Contraception: abstinence   Patient Active Problem List   Diagnosis Date Noted   Intramural uterine fibroid    Menorrhagia with regular cycle    Cervix abnormality    Fibroids, subserous 02/12/2020   Adjustment disorder with depressed mood 10/04/2019   Mild persistent asthma without complication 62/94/7654   Iron deficiency anemia 05/13/2013    Past Medical History:  Diagnosis Date   Fibroids, subserous    GERD (gastroesophageal reflux disease)    History of ovarian cyst    IDA (iron deficiency anemia)    Mild persistent asthma    Wears glasses     Past Surgical History:  Procedure Laterality Date   COLONOSCOPY  08/20/2020   @ [facility]   ESOPHAGOGASTRODUODENOSCOPY  01/07/2020   @ [facility]   HYSTEROSCOPY N/A 10/13/2020   Procedure: HYSTEROSCOPY, DILATION AND CURETTAGE;  Surgeon: Megan Salon, MD;  Location: [facility];  Service: Gynecology;  Laterality: N/A;    MEDS:   Current Outpatient Medications on File Prior to Visit  Medication Sig Dispense Refill   albuterol (VENTOLIN HFA) 108 (90 Base) MCG/ACT inhaler Inhale 2 puffs into the lungs every 6 (six) hours as needed.     buPROPion (WELLBUTRIN XL) 150 MG 24 hr tablet Take 150 mg by mouth daily.     Calcium Carb-Cholecalciferol (CALCIUM + D3 PO) Take by mouth every other day. PER PT TAKES TWO TABLESPOONS QOD     ferrous sulfate (FER-IN-SOL) 75 (15 Fe) MG/ML  SOLN Take by mouth every other day. PER PT ONE OUNCE QOD     fluticasone (FLOVENT HFA) 110 MCG/ACT inhaler Inhale 2 puffs into the lungs 2 (two) times daily as needed. Prescribed twice daily but pt is only using as needed     mirtazapine (REMERON) 7.5 MG tablet Take 1 tablet (7.5 mg total) by mouth at bedtime. 90 tablet 0   pantoprazole (PROTONIX) 40 MG tablet Take 1 tablet (40 mg total) by mouth daily. 90 tablet 0   DM-Phenylephrine-Acetaminophen (VICKS DAYQUIL COLD & FLU) 10-5-325 MG CAPS Take 1 capsule by mouth. (Patient not taking: Reported on 03/29/2021)     ibuprofen (ADVIL) 800 MG tablet Take 1 tablet (800 mg total) by mouth every 8 (eight) hours as needed. (Patient not taking: Reported on 03/29/2021) 30 tablet 0   No current facility-administered medications on file prior to visit.    ALLERGIES: Patient has no known allergies.  Family History  Problem Relation Age of Onset   Hypertension Mother    Alcohol abuse Father        now in remission   Lupus Maternal Aunt    Heart disease Maternal Aunt     SH:  single, non smoker  Review of Systems  Genitourinary:        Irregular spotting   PHYSICAL EXAMINATION:    BP 132/86 (BP Location: Right Arm, Patient Position: Sitting, Cuff Size: Normal)    Pulse  72    Ht 5\' 2"  (1.575 m) Comment: reported   Wt 142 lb 3.2 oz (64.5 kg)    LMP 03/06/2021    BMI 26.01 kg/m     General appearance: alert, cooperative and appears stated age Abdomen: soft, non-tender; bowel sounds normal; no masses,  no organomegaly Lymph:  no inguinal LAD noted  Pelvic: External genitalia:  no lesions              Urethra:  normal appearing urethra with no masses, tenderness or lesions              Bartholins and Skenes: normal                 Vagina: normal appearing vagina with normal color, yellowish discharge present, no lesions              Cervix: no lesions              Bimanual Exam:  Uterus:  about 8 weeks, anterior nodularity noted, mobile and non  tender              Adnexa: no mass, fullness, tenderness  Assessment/Plan: 1. Fibroids, subserous - will recheck fibroids 3 months after Sonata procedure.   - US PELVIS TRANSVAGINAL NON-OB (TV ONLY); Future  2. Iron deficiency anemia, unspecified iron deficiency anemia type - pre op hb was normal.  Depending on bleeding, may need to recheck this at follow up  3. Menorrhagia with regular cycle  Total time with pt and documentation: 22 minutes

## 2021-04-27 DIAGNOSIS — U071 COVID-19: Secondary | ICD-10-CM

## 2021-04-27 HISTORY — DX: COVID-19: U07.1

## 2021-04-28 ENCOUNTER — Encounter: Payer: Self-pay | Admitting: Physician Assistant

## 2021-04-30 ENCOUNTER — Encounter: Payer: Self-pay | Admitting: Physician Assistant

## 2021-04-30 ENCOUNTER — Telehealth (INDEPENDENT_AMBULATORY_CARE_PROVIDER_SITE_OTHER): Payer: BC Managed Care – PPO | Admitting: Physician Assistant

## 2021-04-30 VITALS — HR 65 | Ht 62.0 in

## 2021-04-30 DIAGNOSIS — U071 COVID-19: Secondary | ICD-10-CM | POA: Diagnosis not present

## 2021-04-30 MED ORDER — ALBUTEROL SULFATE HFA 108 (90 BASE) MCG/ACT IN AERS: 2.0000 | INHALATION_SPRAY | Freq: Four times a day (QID) | RESPIRATORY_TRACT | 2 refills | Status: DC | PRN

## 2021-04-30 MED ORDER — FLUTICASONE PROPIONATE HFA 110 MCG/ACT IN AERO: 2.0000 | INHALATION_SPRAY | Freq: Two times a day (BID) | RESPIRATORY_TRACT | 2 refills | Status: DC | PRN

## 2021-04-30 MED ORDER — PREDNISONE 20 MG PO TABS: 20.0000 mg | ORAL_TABLET | Freq: Two times a day (BID) | ORAL | 0 refills | Status: AC

## 2021-04-30 NOTE — Patient Instructions (Signed)
Please continue to use your inhalers.  Take the prednisone as directed.  Call or send a MyChart message with update on how you are doing next week. ?

## 2021-04-30 NOTE — [place] Notes (Signed)
? ?  Virtual Visit via Video Note ? ?I connected with  Erika Frazier  on 04/30/21 at 11:30 AM EST by a video enabled telemedicine application and verified that I am speaking with the correct person using two identifiers. ? ?Location: ?Patient: home ?Provider: Therapist, music at [employer] ?Persons present: Patient and myself ?  ?I discussed the limitations of evaluation and management by telemedicine and the availability of in person appointments. The patient expressed understanding and agreed to proceed.  ? ?History of Present Illness: ? ?Chief complaint: COVID-19 positive home test on 04/26/21 ?Symptom onset: 04/24/21 ?Pertinent positives: Headaches, body aches, back pain, productive mucous, ST, intermittent SOB / chest tightness ?Pertinent negatives: Chest pain, N/V/D ?Treatments tried: Flovent, albuterol rescue inhaler, Daytime and nighttime Mucinex ?Vaccine status: Not UTD ? ?Symptoms are starting to improve some since symptom onset ? ?  ?Observations/Objective: ? ? ?Gen: Awake, alert, no acute distress ?Resp: Breathing is even and non-labored ?Psych: calm/pleasant demeanor ?Neuro: Alert and Oriented x 3, + facial symmetry, speech is clear. ? ? ?Assessment and Plan: ? ?1. COVID-19 ?Diagnosis confirmed via home antigen test.  Patient is currently having mild symptoms.  We discussed current algorithm recommendations for prescribing outpatient antivirals.  As the patient is outside of the 5-day window from symptom onset, antivirals would not be indicated at this time.  ? ?She does have a history of asthma.  I refilled her Flovent and albuterol inhaler today.  I have also sent in a burst of prednisone for her to take twice a day for the next 5 days. ? ?Advised self-isolation at home for the next 5 days and then masking around others for at least an additional 5 days.  Treat supportively at this time including sleeping prone, deep breathing exercises, pushing fluids, walking every few hours, vitamins C  and D, and Tylenol or ibuprofen as needed.  The patient understands that COVID-19 illness can wax and wane.  Should the symptoms acutely worsen or patient starts to experience sudden shortness of breath, chest pain, severe weakness, the patient will go straight to the emergency department.  Also advised home pulse oximetry monitoring and for any reading consistently under 92%, should also report to the emergency department.  The patient will continue to keep [name] updated. ? ? ?Follow Up Instructions: ? ?  ?I discussed the assessment and treatment plan with the patient. The patient was provided an opportunity to ask questions and all were answered. The patient agreed with the plan and demonstrated an understanding of the instructions. ?  ?The patient was advised to call back or seek an in-person evaluation if the symptoms worsen or if the condition fails to improve as anticipated. ? ?Lizandra Zakrzewski M Aristides Luckey, PA-C ? ?

## 2021-05-08 ENCOUNTER — Encounter (HOSPITAL_BASED_OUTPATIENT_CLINIC_OR_DEPARTMENT_OTHER): Payer: Self-pay | Admitting: Obstetrics & Gynecology

## 2021-05-10 ENCOUNTER — Ambulatory Visit: Payer: BC Managed Care – PPO | Admitting: Physician Assistant

## 2021-05-10 ENCOUNTER — Ambulatory Visit (INDEPENDENT_AMBULATORY_CARE_PROVIDER_SITE_OTHER)
Admission: RE | Admit: 2021-05-10 | Discharge: 2021-05-10 | Disposition: A | Discharge status: Home / Self Care | Payer: BC Managed Care – PPO | Source: Ambulatory Visit | Attending: Physician Assistant | Admitting: Physician Assistant

## 2021-05-10 ENCOUNTER — Other Ambulatory Visit: Payer: Self-pay

## 2021-05-10 DIAGNOSIS — R062 Wheezing: Secondary | ICD-10-CM | POA: Diagnosis not present

## 2021-05-10 DIAGNOSIS — J4530 Mild persistent asthma, uncomplicated: Secondary | ICD-10-CM

## 2021-05-10 DIAGNOSIS — R0602 Shortness of breath: Secondary | ICD-10-CM

## 2021-05-10 DIAGNOSIS — U099 Post covid-19 condition, unspecified: Secondary | ICD-10-CM | POA: Diagnosis not present

## 2021-05-11 ENCOUNTER — Other Ambulatory Visit: Payer: Self-pay | Admitting: Physician Assistant

## 2021-05-11 ENCOUNTER — Other Ambulatory Visit (HOSPITAL_BASED_OUTPATIENT_CLINIC_OR_DEPARTMENT_OTHER): Payer: Self-pay | Admitting: Obstetrics & Gynecology

## 2021-05-11 ENCOUNTER — Encounter: Payer: Self-pay | Admitting: Physician Assistant

## 2021-05-11 MED ORDER — NORETHINDRONE ACETATE 5 MG PO TABS: 5.0000 mg | ORAL_TABLET | Freq: Two times a day (BID) | ORAL | 0 refills | Status: DC

## 2021-05-11 MED ORDER — PREDNISONE 20 MG PO TABS: 20.0000 mg | ORAL_TABLET | Freq: Two times a day (BID) | ORAL | 0 refills | Status: AC

## 2021-05-12 ENCOUNTER — Ambulatory Visit (INDEPENDENT_AMBULATORY_CARE_PROVIDER_SITE_OTHER): Payer: BC Managed Care – PPO

## 2021-05-12 ENCOUNTER — Encounter (HOSPITAL_BASED_OUTPATIENT_CLINIC_OR_DEPARTMENT_OTHER): Payer: Self-pay | Admitting: Obstetrics & Gynecology

## 2021-05-12 ENCOUNTER — Other Ambulatory Visit: Payer: Self-pay

## 2021-05-12 ENCOUNTER — Ambulatory Visit (INDEPENDENT_AMBULATORY_CARE_PROVIDER_SITE_OTHER): Payer: BC Managed Care – PPO | Admitting: Obstetrics & Gynecology

## 2021-05-12 VITALS — BP 160/96 | HR 110 | Ht 62.25 in | Wt 148.4 lb

## 2021-05-12 DIAGNOSIS — N920 Excessive and frequent menstruation with regular cycle: Secondary | ICD-10-CM

## 2021-05-12 DIAGNOSIS — Z1231 Encounter for screening mammogram for malignant neoplasm of breast: Secondary | ICD-10-CM

## 2021-05-12 DIAGNOSIS — D509 Iron deficiency anemia, unspecified: Secondary | ICD-10-CM | POA: Diagnosis not present

## 2021-05-12 DIAGNOSIS — D252 Subserosal leiomyoma of uterus: Secondary | ICD-10-CM | POA: Diagnosis not present

## 2021-05-16 ENCOUNTER — Other Ambulatory Visit: Payer: Self-pay | Admitting: Physician Assistant

## 2021-05-16 MED ORDER — ALBUTEROL SULFATE (2.5 MG/3ML) 0.083% IN NEBU: 2.5000 mg | INHALATION_SOLUTION | Freq: Four times a day (QID) | RESPIRATORY_TRACT | 1 refills | Status: DC | PRN

## 2021-05-18 ENCOUNTER — Encounter (HOSPITAL_BASED_OUTPATIENT_CLINIC_OR_DEPARTMENT_OTHER): Payer: Self-pay | Admitting: Obstetrics & Gynecology

## 2021-05-18 NOTE — [place] Notes (Signed)
GYNECOLOGY  VISIT ? ?CC:   follow up after surgical procedure ? ?HPI: ?41 y.o. G0P0000 Significant Other Black or [place] American female here for follow up after undergoing RFA treatment of fibroids.  Reports until last week, bleeding has been much better.  She started bleeding after intercourse last week.  It is not heavy.  She started norethindrone and it is very light.  Today, ultrasound showed uterus measuring 9.6cm x 5.4cm x 5.7cm with fibroids measuring 3.7cm, 2.6cm and 1.8cm.  Fibroids are less in number and stable or smaller in size since procedure.  Discussed with pt continued treatment with progesterone until next cycle would be due to see if can get this back on schedule.  Pt comfortable with plan. ? ?Using condoms for contraception. ? ?Patient Active Problem List  ? Diagnosis Date Noted  ? Intramural uterine fibroid   ? Menorrhagia with regular cycle   ? Cervix abnormality   ? Fibroids, subserous 02/12/2020  ? Adjustment disorder with depressed mood 10/04/2019  ? Mild persistent asthma without complication 61/95/0932  ? Iron deficiency anemia 05/13/2013  ? ? ?Past Medical History:  ?Diagnosis Date  ? Fibroids, subserous   ? GERD (gastroesophageal reflux disease)   ? History of ovarian cyst   ? IDA (iron deficiency anemia)   ? Mild persistent asthma   ? Wears glasses   ? ? ?Past Surgical History:  ?Procedure Laterality Date  ? COLONOSCOPY  08/20/2020  ? @ [facility]  ? ESOPHAGOGASTRODUODENOSCOPY  01/07/2020  ? @ [facility]  ? HYSTEROSCOPY N/A 10/13/2020  ? Procedure: HYSTEROSCOPY, DILATION AND CURETTAGE;  Surgeon: Megan Salon, MD;  Location: [facility];  Service: Gynecology;  Laterality: N/A;  ? ? ?MEDS:   ?Current Outpatient Medications on File Prior to Visit  ?Medication Sig Dispense Refill  ? albuterol (VENTOLIN HFA) 108 (90 Base) MCG/ACT inhaler Inhale 2 puffs into the lungs every 6 (six) hours as needed. 1 each 2  ? buPROPion (WELLBUTRIN XL) 150 MG 24 hr tablet  Take 150 mg by mouth daily.    ? Calcium Carb-Cholecalciferol (CALCIUM + D3 PO) Take by mouth every other day. PER PT TAKES TWO TABLESPOONS QOD    ? DM-Phenylephrine-Acetaminophen (VICKS DAYQUIL COLD & FLU) 10-5-325 MG CAPS Take 1 capsule by mouth.    ? ferrous sulfate (FER-IN-SOL) 75 (15 Fe) MG/ML SOLN Take by mouth every other day. PER PT ONE OUNCE QOD    ? fluticasone (FLOVENT HFA) 110 MCG/ACT inhaler Inhale 2 puffs into the lungs 2 (two) times daily as needed. Prescribed twice daily but pt is only using as needed 1 each 2  ? ibuprofen (ADVIL) 800 MG tablet Take 1 tablet (800 mg total) by mouth every 8 (eight) hours as needed. 30 tablet 0  ? mirtazapine (REMERON) 7.5 MG tablet Take 1 tablet (7.5 mg total) by mouth at bedtime. 90 tablet 0  ? norethindrone (AYGESTIN) 5 MG tablet Take 1 tablet (5 mg total) by mouth in the morning and at bedtime. 30 tablet 0  ? pantoprazole (PROTONIX) 40 MG tablet Take 1 tablet (40 mg total) by mouth daily. 90 tablet 0  ? ?No current facility-administered medications on file prior to visit.  ? ? ?ALLERGIES: Patient has no known allergies. ? ?Family History  ?Problem Relation Age of Onset  ? Hypertension Mother   ? Alcohol abuse Father   ?     now in remission  ? Lupus Maternal Aunt   ? Heart disease Maternal  Aunt   ? ? ?SH:  single, non smoker ? ?Review of Systems  ?All other systems reviewed and are negative. ? ?PHYSICAL EXAMINATION:   ? ?BP (!) 160/96 (BP Location: Left Arm, Patient Position: Sitting, Cuff Size: Normal)   Pulse (!) 110   Ht 5' 2.25" (1.581 m)   Wt 148 lb 6.4 oz (67.3 kg)   LMP 04/29/2021   BMI 26.93 kg/m?     ?General appearance: alert, cooperative and appears stated age ?No other exam performed today ? ?Assessment/Plan: ?1. Menorrhagia with regular cycle ?- improved after RFA ?- with current irregular bleeding, pt is going to continue on norethindrone for next two weeks and then stop.  Should have a cycle after that and will stay off the medication to see how  her cycle is after that time ? ?2. Fibroids, subserous ? ?3. Iron deficiency anemia, unspecified iron deficiency anemia type ?- improved in December to 12.0 ? ?4. Encounter for screening mammogram for malignant neoplasm of breast ?- MM 3D SCREEN BREAST BILATERAL; Future ? ? ? ? ?

## 2021-05-19 ENCOUNTER — Other Ambulatory Visit: Payer: Self-pay

## 2021-05-19 DIAGNOSIS — K2100 Gastro-esophageal reflux disease with esophagitis, without bleeding: Secondary | ICD-10-CM

## 2021-05-19 MED ORDER — PANTOPRAZOLE SODIUM 40 MG PO TBEC: 40.0000 mg | DELAYED_RELEASE_TABLET | Freq: Every day | ORAL | 0 refills | Status: DC

## 2021-05-31 ENCOUNTER — Encounter (HOSPITAL_BASED_OUTPATIENT_CLINIC_OR_DEPARTMENT_OTHER): Payer: Self-pay | Admitting: Obstetrics & Gynecology

## 2021-06-01 ENCOUNTER — Encounter: Payer: Self-pay | Admitting: Physician Assistant

## 2021-06-02 NOTE — Telephone Encounter (Signed)
LVM for pt to call and schedule a visit. ?

## 2021-06-03 ENCOUNTER — Encounter (HOSPITAL_BASED_OUTPATIENT_CLINIC_OR_DEPARTMENT_OTHER): Payer: Self-pay | Admitting: Obstetrics & Gynecology

## 2021-06-03 ENCOUNTER — Other Ambulatory Visit (HOSPITAL_BASED_OUTPATIENT_CLINIC_OR_DEPARTMENT_OTHER): Payer: Self-pay | Admitting: Obstetrics & Gynecology

## 2021-06-03 MED ORDER — SULFAMETHOXAZOLE-TRIMETHOPRIM 800-160 MG PO TABS: 1.0000 | ORAL_TABLET | Freq: Two times a day (BID) | ORAL | 0 refills | Status: DC

## 2021-06-27 ENCOUNTER — Encounter (HOSPITAL_BASED_OUTPATIENT_CLINIC_OR_DEPARTMENT_OTHER): Payer: Self-pay | Admitting: Obstetrics & Gynecology

## 2021-06-28 ENCOUNTER — Ambulatory Visit
Admission: RE | Admit: 2021-06-28 | Discharge: 2021-06-28 | Disposition: A | Discharge status: Home / Self Care | Payer: BC Managed Care – PPO | Source: Ambulatory Visit | Attending: Obstetrics & Gynecology | Admitting: Obstetrics & Gynecology

## 2021-06-28 DIAGNOSIS — Z1231 Encounter for screening mammogram for malignant neoplasm of breast: Secondary | ICD-10-CM

## 2021-06-28 MED ORDER — METRONIDAZOLE 500 MG PO TABS: 500.0000 mg | ORAL_TABLET | Freq: Two times a day (BID) | ORAL | 0 refills | Status: DC

## 2021-07-02 ENCOUNTER — Other Ambulatory Visit: Payer: Self-pay | Admitting: Obstetrics & Gynecology

## 2021-07-02 DIAGNOSIS — R928 Other abnormal and inconclusive findings on diagnostic imaging of breast: Secondary | ICD-10-CM

## 2021-07-04 ENCOUNTER — Other Ambulatory Visit: Payer: Self-pay | Admitting: Physician Assistant

## 2021-07-05 MED ORDER — MIRTAZAPINE 15 MG PO TABS: 15.0000 mg | ORAL_TABLET | Freq: Every day | ORAL | 1 refills | Status: DC

## 2021-07-05 NOTE — Telephone Encounter (Signed)
Pt asking to change back to '15mg'$  according to pharmacy note. ? ? ?

## 2021-07-08 ENCOUNTER — Ambulatory Visit (INDEPENDENT_AMBULATORY_CARE_PROVIDER_SITE_OTHER): Payer: BC Managed Care – PPO | Admitting: Family

## 2021-07-08 ENCOUNTER — Encounter: Payer: Self-pay | Admitting: Family

## 2021-07-08 VITALS — BP 142/84 | HR 61 | Temp 98.3°F | Ht 62.0 in | Wt 141.1 lb

## 2021-07-08 DIAGNOSIS — R519 Headache, unspecified: Secondary | ICD-10-CM | POA: Diagnosis not present

## 2021-07-08 DIAGNOSIS — K2100 Gastro-esophageal reflux disease with esophagitis, without bleeding: Secondary | ICD-10-CM

## 2021-07-08 MED ORDER — METHOCARBAMOL 500 MG PO TABS: 500.0000 mg | ORAL_TABLET | Freq: Three times a day (TID) | ORAL | 0 refills | Status: DC | PRN

## 2021-07-08 NOTE — Patient Instructions (Addendum)
It was very nice to see you today! ? ?Try to call the [facility] office [phone] again and let them know you signed a record release today for [facility] ([place] gastroenterology office and can they go ahead and schedule an appointment for you.   ? ?You can add Famotidine, generic Pepcid, twice a day before meals and see if this helps your reflux symptoms before you can be seen. ? ?I am sending generic Robaxin, a muscle relaxer for you headaches, start with 1 pill, if doesn't cause drowsiness you can take 2 pill 3 times per day as needed. ?Also can take generic Aleve, 1-2 pills twice a day, prefer this over Ibuprofen, Advil, or Motrin to not worsen your reflux symptoms. ? ? ? ?PLEASE NOTE: ? ?If you had any lab tests please let us know if you have not heard back within a few days. You may see your results on MyChart before we have a chance to review them but we will give you a call once they are reviewed by [name]. If we ordered any referrals today, please let us know if you have not heard from their office within the next week.  ? ?Please try these tips to maintain a healthy lifestyle: ? ?Eat most of your calories during the day when you are active. Eliminate processed foods including packaged sweets (pies, cakes, cookies), reduce intake of potatoes, white bread, white pasta, and white rice. Look for whole grain options, oat flour or almond flour. ? ?Each meal should contain half fruits/vegetables, one quarter protein, and one quarter carbs (no bigger than a computer mouse). ? ?Cut down on sweet beverages. This includes juice, soda, and sweet tea. Also watch fruit intake, though this is a healthier sweet option, it still contains natural sugar! Limit to 3 servings daily. ? ?Drink at least 1 glass of water with each meal and aim for at least 8 glasses per day ? ?Exercise at least 150 minutes every week.  ? ?

## 2021-07-08 NOTE — Progress Notes (Signed)
? ?Subjective:  ? ? ? Patient ID: Erika Frazier, female    DOB: 06-08-1980, 41 y.o.   MRN: [id number] ? ?Chief Complaint  ?Patient presents with  ? Gastroesophageal Reflux  ?  Pt c/o Abdominal pain/Sternum area,  headache loss of appetite. Bowels are loose, Has been going on for a week. Take pantoprazole '40mg'$  every morning.   ? ?HPI: ?GERD: Patient is presenting for follow up. This has been associated with chest pain and midespigastric pain.  She denies abdominal bloating, belching and eructation, deep pressure at base of neck, hoarseness, nausea, and regurgitation of undigested food. Worsening of symptoms have been present for 1 week. She has had dysphagia for both solids and liquids.  She has not lost weight. She denies melena, hematochezia, hematemesis, and coffee ground emesis. Medical therapy in the past has included proton pump inhibitors.Reports having esophagus stretched 1.5 years ago, and has been taking Protonix. '40mg'$  qd. States she feels her current symptoms are similar to what she had 1.5 years ago. ?Headaches:  reports having more headaches off and on for last week or 2, reports increased stress, denies any sinus or allergy sx, no history of persistent HA. Ibuprofen is relieving the pain, but she is concerned if worsening her GERD sx.  ? ?Assessment & Plan:  ? ?Problem List Items Addressed This Visit   ?None ?Visit Diagnoses   ? ? Gastroesophageal reflux disease with esophagitis without hemorrhage    -  Primary ?taking Protonix qd, advised to try OTC generic Pepcid bid before meals. pt was referred to GI in Feb and was told office waiting on her [facility] records and never heard anything else. She is signing a medical release of records today, advised pt to call [facility] and let them know records release faxed and see if they will schedule her an appointment due to her worsening sx.  ?  ? Persistent headaches      ? Advised to stop taking daily Ibuprofen and switch to generic Aleve OTC 1-2 tabs twice  a day prn for pain. Also sending Robaxin, advised on use & SE, f/u with PCP prn. ? ?Relevant Medications  ? methocarbamol (ROBAXIN) 500 MG tablet  ? ?  ? ? ?Outpatient Medications Prior to Visit  ?Medication Sig Dispense Refill  ? albuterol (PROVENTIL) (2.5 MG/3ML) 0.083% nebulizer solution Take 3 mLs (2.5 mg total) by nebulization every 6 (six) hours as needed for wheezing or shortness of breath. 150 mL 1  ? albuterol (VENTOLIN HFA) 108 (90 Base) MCG/ACT inhaler Inhale 2 puffs into the lungs every 6 (six) hours as needed. 1 each 2  ? buPROPion (WELLBUTRIN XL) 150 MG 24 hr tablet Take 150 mg by mouth daily.    ? Calcium Carb-Cholecalciferol (CALCIUM + D3 PO) Take by mouth every other day. PER PT TAKES TWO TABLESPOONS QOD    ? ferrous sulfate (FER-IN-SOL) 75 (15 Fe) MG/ML SOLN Take by mouth every other day. PER PT ONE OUNCE QOD    ? fluticasone (FLOVENT HFA) 110 MCG/ACT inhaler Inhale 2 puffs into the lungs 2 (two) times daily as needed. Prescribed twice daily but pt is only using as needed 1 each 2  ? ibuprofen (ADVIL) 800 MG tablet Take 1 tablet (800 mg total) by mouth every 8 (eight) hours as needed. 30 tablet 0  ? mirtazapine (REMERON) 15 MG tablet Take 1 tablet (15 mg total) by mouth at bedtime. 30 tablet 1  ? pantoprazole (PROTONIX) 40 MG tablet Take 1 tablet (40  mg total) by mouth daily. 90 tablet 0  ? DM-Phenylephrine-Acetaminophen (VICKS DAYQUIL COLD & FLU) 10-5-325 MG CAPS Take 1 capsule by mouth.    ? metroNIDAZOLE (FLAGYL) 500 MG tablet Take 1 tablet (500 mg total) by mouth 2 (two) times daily. 14 tablet 0  ? norethindrone (AYGESTIN) 5 MG tablet Take 1 tablet (5 mg total) by mouth in the morning and at bedtime. 30 tablet 0  ? sulfamethoxazole-trimethoprim (BACTRIM DS) 800-160 MG tablet Take 1 tablet by mouth 2 (two) times daily. 6 tablet 0  ? ?No facility-administered medications prior to visit.  ? ? ?Past Medical History:  ?Diagnosis Date  ? Fibroids, subserous   ? GERD (gastroesophageal reflux disease)    ? History of ovarian cyst   ? IDA (iron deficiency anemia)   ? Mild persistent asthma   ? Wears glasses   ? ? ?Past Surgical History:  ?Procedure Laterality Date  ? COLONOSCOPY  08/20/2020  ? @ [facility]  ? ESOPHAGOGASTRODUODENOSCOPY  01/07/2020  ? @ [facility]  ? HYSTEROSCOPY N/A 10/13/2020  ? Procedure: HYSTEROSCOPY, DILATION AND CURETTAGE;  Surgeon: Megan Salon, MD;  Location: [facility];  Service: Gynecology;  Laterality: N/A;  ? ? ?No Known Allergies ? ?   ?Objective:  ?  ?Physical Exam ?Vitals and nursing note reviewed.  ?Constitutional:   ?   Appearance: Normal appearance.  ?Cardiovascular:  ?   Rate and Rhythm: Normal rate and regular rhythm.  ?Pulmonary:  ?   Effort: Pulmonary effort is normal.  ?   Breath sounds: Normal breath sounds.  ?Musculoskeletal:     ?   General: Normal range of motion.  ?Skin: ?   General: Skin is warm and dry.  ?Neurological:  ?   Mental Status: She is alert.  ?Psychiatric:     ?   Mood and Affect: Mood normal.     ?   Behavior: Behavior normal.  ? ? ?BP (!) 142/84 (BP Location: Left Arm, Patient Position: Sitting, Cuff Size: Large)   Pulse 61   Temp 98.3 ?F (36.8 ?C) (Temporal)   Ht '5\' 2"'$  (1.575 m)   Wt 141 lb 2 oz (64 kg)   SpO2 91%   BMI 25.81 kg/m?  ?Wt Readings from Last 3 Encounters:  ?07/08/21 141 lb 2 oz (64 kg)  ?05/12/21 148 lb 6.4 oz (67.3 kg)  ?03/29/21 142 lb 3.2 oz (64.5 kg)  ? ? ?   ? ?Meds ordered this encounter  ?Medications  ? methocarbamol (ROBAXIN) 500 MG tablet  ?  Sig: Take 1-2 tablets (500-1,000 mg total) by mouth every 8 (eight) hours as needed for muscle spasms. Or headache pain.  ?  Dispense:  30 tablet  ?  Refill:  0  ?  Order Specific Question:   Supervising Provider  ?  Answer:   ANDY, CAMILLE L [[id number]]  ? ? ?Jeanie Sewer, NP ? ?

## 2021-07-12 ENCOUNTER — Ambulatory Visit: Payer: BC Managed Care – PPO | Admitting: Physician Assistant

## 2021-07-14 ENCOUNTER — Ambulatory Visit
Admission: RE | Admit: 2021-07-14 | Discharge: 2021-07-14 | Disposition: A | Discharge status: Home / Self Care | Payer: BC Managed Care – PPO | Source: Ambulatory Visit | Attending: Obstetrics & Gynecology | Admitting: Obstetrics & Gynecology

## 2021-07-14 DIAGNOSIS — R928 Other abnormal and inconclusive findings on diagnostic imaging of breast: Secondary | ICD-10-CM

## 2021-07-14 DIAGNOSIS — R922 Inconclusive mammogram: Secondary | ICD-10-CM | POA: Diagnosis not present

## 2021-07-27 ENCOUNTER — Other Ambulatory Visit: Payer: Self-pay | Admitting: Family

## 2021-07-27 DIAGNOSIS — R519 Headache, unspecified: Secondary | ICD-10-CM

## 2021-08-06 ENCOUNTER — Telehealth: Payer: Self-pay | Admitting: Gastroenterology

## 2021-08-06 ENCOUNTER — Encounter (HOSPITAL_BASED_OUTPATIENT_CLINIC_OR_DEPARTMENT_OTHER): Payer: Self-pay | Admitting: Obstetrics & Gynecology

## 2021-08-06 NOTE — Telephone Encounter (Signed)
Hi Dr. Havery Moros,   We received a referral from PCP for patient to be evaluated for  Unintentional weight loss\Gastroesophageal reflux disease with esophagitis, unspecified whether hemorrhage  REF25 - AMB REFERRAL TO GASTROENTEROLOGY      Patient ha GI history with [facility] records were obtained for you to review and advise on scheduling.   Thanks

## 2021-08-09 ENCOUNTER — Ambulatory Visit (HOSPITAL_BASED_OUTPATIENT_CLINIC_OR_DEPARTMENT_OTHER): Payer: BC Managed Care – PPO | Admitting: Obstetrics & Gynecology

## 2021-08-09 NOTE — Progress Notes (Signed)
Subjective:    Erika Frazier is a 41 y.o. female and is here for a comprehensive physical exam.   HPI  There are no preventive care reminders to display for this patient.   Acute Concerns: Back Pain Patient complain of back pain that has been present for the past 4 months. Located on mid back. States her symptoms started since having Covid. Has had Covid in February this year. States she was experiencing some tingling in arms at that time. She was prescribed muscle relaxer at that time. She has not tried any other specific treatment for this issue. Worse with certain motions. States pain is worse with bending. No fever or chills. Denies dysuria or hematuria. No hx of kidney stones.  No weakness in b/l legs or saddle anesthesia.  Chronic Issues: GERD Patient states she does get heartburn after eating dinner at night. She also has had difficulty swallowing food.  Patient is requesting refill on Protonix 40 mg daily. Tolerating her medication well. No side effects. She has had lost 7-8 days since colonoscopy. Denies rectal bleeding or nausea. No vomiting.   Depression  Patient has been doing well. She takes Remeron 15 mg at night and feels this has been helping. She is requesting refill on Remeron today. Denies SI/HI.    Health Maintenance: Immunizations -- UTD Colonoscopy -- UTD Mammogram -- UTD PAP -- UTD Bone Density -- N/A Diet -- Unable to eat often due to frequent hearburn Sleep habits -- No concern Exercise -- None specific Weight -- 141 Ib (64 kg) Mood -- Better  Weight history: Wt Readings from Last 10 Encounters:  08/10/21 141 lb (64 kg)  07/08/21 141 lb 2 oz (64 kg)  05/12/21 148 lb 6.4 oz (67.3 kg)  03/29/21 142 lb 3.2 oz (64.5 kg)  02/16/21 141 lb 8 oz (64.2 kg)  01/27/21 136 lb (61.7 kg)  12/23/20 136 lb (61.7 kg)  10/13/20 138 lb (62.6 kg)  08/04/20 144 lb (65.3 kg)  03/18/20 140 lb (63.5 kg)   Body mass index is 24.98 kg/m. Patient's last menstrual  period was 08/07/2021 (exact date). Alcohol use:  reports current alcohol use of about 4.0 standard drinks of alcohol per week. Tobacco use: None Tobacco Use: Medium Risk (08/10/2021)   Patient History    Smoking Tobacco Use: Former    Smokeless Tobacco Use: Never    Passive Exposure: Not on file        08/10/2021    9:13 AM  Depression screen PHQ 2/9  Decreased Interest 1  Down, Depressed, Hopeless 1  PHQ - 2 Score 2  Altered sleeping 0  Tired, decreased energy 1  Change in appetite 3  Feeling bad or failure about yourself  0  Trouble concentrating 1  Moving slowly or fidgety/restless 1  Suicidal thoughts 0  PHQ-9 Score 8  Difficult doing work/chores Somewhat difficult     Other providers/specialists: Patient Care Team: Inda Coke, [place] as PCP - General (Physician Assistant)    PMHx, SurgHx, SocialHx, Medications, and Allergies were reviewed in the Visit Navigator and updated as appropriate.   Past Medical History:  Diagnosis Date   Allergy    Anxiety    COVID-19 04/27/2021   Depression    Fibroids, subserous    GERD (gastroesophageal reflux disease)    History of ovarian cyst    Hypertension    IDA (iron deficiency anemia)    Mild persistent asthma    Wears glasses      Past  Surgical History:  Procedure Laterality Date   COLONOSCOPY  08/20/2020   @ [facility]   ESOPHAGOGASTRODUODENOSCOPY  01/07/2020   @ [facility]   HYSTEROSCOPY N/A 10/13/2020   Procedure: HYSTEROSCOPY, DILATION AND CURETTAGE;  Surgeon: Megan Salon, MD;  Location: [facility];  Service: Gynecology;  Laterality: N/A;     Family History  Problem Relation Age of Onset   Hypertension Mother    Alcohol abuse Father        now in remission   Lupus Maternal Aunt    Heart disease Maternal Aunt    Depression Maternal Aunt    Colon cancer Maternal Great-grandmother    Breast cancer Neg Hx     Social History   Tobacco Use   Smoking status:  Former    Types: Cigars   Smokeless tobacco: Never   Tobacco comments:    Doesn't smoke tobacco  Vaping Use   Vaping Use: Never used  Substance Use Topics   Alcohol use: Yes    Alcohol/week: 4.0 standard drinks of alcohol    Types: 2 Glasses of wine, 2 Shots of liquor per week    Comment: occasional   Drug use: Yes    Types: Marijuana    Comment: 10-08-2020  per pt smokes average 3 times per week    Review of Systems:   Review of Systems  Constitutional:  Negative for chills, fever, malaise/fatigue and weight loss.  HENT:  Negative for hearing loss, sinus pain and sore throat.   Respiratory:  Negative for cough and hemoptysis.   Cardiovascular:  Negative for chest pain, palpitations, leg swelling and PND.  Gastrointestinal:  Positive for heartburn. Negative for abdominal pain, constipation, diarrhea, nausea and vomiting.  Genitourinary:  Negative for dysuria, frequency and urgency.  Musculoskeletal:  Positive for back pain. Negative for myalgias and neck pain.  Skin:  Negative for itching and rash.  Neurological:  Negative for dizziness, tingling, seizures and headaches.  Endo/Heme/Allergies:  Negative for polydipsia.  Psychiatric/Behavioral:  Negative for depression. The patient is not nervous/anxious.     Objective:   BP 124/80 (BP Location: Left Arm, Patient Position: Sitting, Cuff Size: Normal)   Pulse 82   Temp 98.2 F (36.8 C) (Temporal)   Ht '5\' 3"'$  (1.6 m)   Wt 141 lb (64 kg)   LMP 08/07/2021 (Exact Date)   SpO2 97%   BMI 24.98 kg/m  Body mass index is 24.98 kg/m.  General Appearance:    Alert, cooperative, no distress, appears stated age  Head:    Normocephalic, without obvious abnormality, atraumatic  Eyes:    PERRL, conjunctiva/corneas clear, EOM's intact, fundi    benign, both eyes  Ears:    Normal TM's and external ear canals, both ears  Nose:   Nares normal, septum midline, mucosa normal, no drainage    or sinus tenderness  Throat:   Lips, mucosa, and  tongue normal; teeth and gums normal  Neck:   Supple, symmetrical, trachea midline, no adenopathy;    thyroid:  no enlargement/tenderness/nodules; no carotid   bruit or JVD  Back:     Symmetric, no curvature, ROM normal R thoracic paravertebral muscle tenderness No bony TTP  Lungs:     Clear to auscultation bilaterally, respirations unlabored  Chest Wall:    No tenderness or deformity   Heart:    Regular rate and rhythm, S1 and S2 normal, no murmur, rub or gallop  Breast Exam:    Deferred  Abdomen:     Soft, non-tender, bowel sounds active all four quadrants,    no masses, no organomegaly  Genitalia:    Deferred  Extremities:   Extremities normal, atraumatic, no cyanosis or edema  Pulses:   2+ and symmetric all extremities  Skin:   Skin color, texture, turgor normal, no rashes or lesions  Lymph nodes:   Cervical, supraclavicular, and axillary nodes normal  Neurologic:   CNII-XII intact, normal strength, sensation and reflexes    throughout    Assessment/Plan:   Encounter for general adult medical examination with abnormal findings Today patient counseled on age appropriate routine health concerns for screening and prevention, each reviewed and up to date or declined. Immunizations reviewed and up to date or declined. Labs ordered and reviewed. Risk factors for depression reviewed and negative. Hearing function and visual acuity are intact. ADLs screened and addressed as needed. Functional ability and level of safety reviewed and appropriate. Education, counseling and referrals performed based on assessed risks today. Patient provided with a copy of personalized plan for preventive services.  Adjustment disorder with depressed mood Overall controlled Continue remeron 15 mg daily Denies SI/HI Follow-up in 1 year, sooner if concerns  Gastroesophageal reflux disease with esophagitis without hemorrhage Well controlled when taking protonix 40 mg Continue protonix 40 mg daily Follow-up  with GI as needed  Vitamin D deficiency Update Vit D and provide recommendations accordingly.  Chronic right-sided thoracic back pain No red flags Continue robaxin 500 mg prn  If ongoing, will consider imaging  Lipid screening Update lipid panel and provide recommendations accordingly  Patient Counseling: '[x]'$    Nutrition: Stressed importance of moderation in sodium/caffeine intake, saturated fat and cholesterol, caloric balance, sufficient intake of fresh fruits, vegetables, fiber, calcium, iron, and 1 mg of folate supplement per day (for females capable of pregnancy).  '[x]'$    Stressed the importance of regular exercise.   '[x]'$    Substance Abuse: Discussed cessation/primary prevention of tobacco, alcohol, or other drug use; driving or other dangerous activities under the influence; availability of treatment for abuse.   '[x]'$    Injury prevention: Discussed safety belts, safety helmets, smoke detector, smoking near bedding or upholstery.   '[x]'$    Sexuality: Discussed sexually transmitted diseases, partner selection, use of condoms, avoidance of unintended pregnancy  and contraceptive alternatives.  '[x]'$    Dental health: Discussed importance of regular tooth brushing, flossing, and dental visits.  '[x]'$    Health maintenance and immunizations reviewed. Please refer to Health maintenance section.    I,Savera Zaman,acting as a Education administrator for [employer], PA.,have documented all relevant documentation on the behalf of Inda Coke, PA,as directed by  Inda Coke, PA while in the presence of Inda Coke, [place].   I, Inda Coke, [place], have reviewed all documentation for this visit. The documentation on 08/10/21 for the exam, diagnosis, procedures, and orders are all accurate and complete.  Inda Coke, PA-C [place]

## 2021-08-10 ENCOUNTER — Encounter: Payer: Self-pay | Admitting: Physician Assistant

## 2021-08-10 ENCOUNTER — Ambulatory Visit (INDEPENDENT_AMBULATORY_CARE_PROVIDER_SITE_OTHER): Payer: BC Managed Care – PPO | Admitting: Physician Assistant

## 2021-08-10 VITALS — BP 124/80 | HR 82 | Temp 98.2°F | Ht 63.0 in | Wt 141.0 lb

## 2021-08-10 DIAGNOSIS — F4321 Adjustment disorder with depressed mood: Secondary | ICD-10-CM | POA: Diagnosis not present

## 2021-08-10 DIAGNOSIS — Z13220 Encounter for screening for lipoid disorders: Secondary | ICD-10-CM | POA: Diagnosis not present

## 2021-08-10 DIAGNOSIS — K2100 Gastro-esophageal reflux disease with esophagitis, without bleeding: Secondary | ICD-10-CM

## 2021-08-10 DIAGNOSIS — Z0001 Encounter for general adult medical examination with abnormal findings: Secondary | ICD-10-CM | POA: Diagnosis not present

## 2021-08-10 DIAGNOSIS — M546 Pain in thoracic spine: Secondary | ICD-10-CM | POA: Diagnosis not present

## 2021-08-10 DIAGNOSIS — E559 Vitamin D deficiency, unspecified: Secondary | ICD-10-CM

## 2021-08-10 DIAGNOSIS — G8929 Other chronic pain: Secondary | ICD-10-CM | POA: Diagnosis not present

## 2021-08-10 LAB — COMPREHENSIVE METABOLIC PANEL
ALT: 9 U/L (ref 0–35)
AST: 13 U/L (ref 0–37)
Albumin: 4.1 g/dL (ref 3.5–5.2)
Alkaline Phosphatase: 65 U/L (ref 39–117)
BUN: 14 mg/dL (ref 6–23)
CO2: 30 mEq/L (ref 19–32)
Calcium: 9.2 mg/dL (ref 8.4–10.5)
Chloride: 103 mEq/L (ref 96–112)
Creatinine, Ser: 0.79 mg/dL (ref 0.40–1.20)
GFR: 93.67 mL/min (ref >60.00)
Glucose, Bld: 102 mg/dL — ABNORMAL HIGH (ref 70–99)
Potassium: 3.7 mEq/L (ref 3.5–5.1)
Sodium: 139 mEq/L (ref 135–145)
Total Bilirubin: 0.3 mg/dL (ref 0.2–1.2)
Total Protein: 6.5 g/dL (ref 6.0–8.3)

## 2021-08-10 LAB — URINALYSIS, ROUTINE W REFLEX MICROSCOPIC
Bilirubin Urine: NEGATIVE
Ketones, ur: NEGATIVE
Leukocytes,Ua: NEGATIVE
Nitrite: NEGATIVE
Specific Gravity, Urine: <=1.005 — AB (ref 1.000–1.030)
Total Protein, Urine: NEGATIVE
Urine Glucose: NEGATIVE
Urobilinogen, UA: 0.2 (ref 0.0–1.0)
WBC, UA: NONE SEEN (ref 0–?)
pH: 6 (ref 5.0–8.0)

## 2021-08-10 LAB — LIPID PANEL
Cholesterol: 204 mg/dL — ABNORMAL HIGH (ref 0–200)
HDL: 65.7 mg/dL (ref >39.00)
LDL Cholesterol: 127 mg/dL — ABNORMAL HIGH (ref 0–99)
NonHDL: 138.73
Total CHOL/HDL Ratio: 3
Triglycerides: 58 mg/dL (ref 0.0–149.0)
VLDL: 11.6 mg/dL (ref 0.0–40.0)

## 2021-08-10 LAB — VITAMIN D 25 HYDROXY (VIT D DEFICIENCY, FRACTURES): VITD: 48.82 ng/mL (ref 30.00–100.00)

## 2021-08-10 LAB — CBC WITH DIFFERENTIAL/PLATELET
Basophils Absolute: 0 10*3/uL (ref 0.0–0.1)
Basophils Relative: 0.8 % (ref 0.0–3.0)
Eosinophils Absolute: 0.1 10*3/uL (ref 0.0–0.7)
Eosinophils Relative: 2.5 % (ref 0.0–5.0)
HCT: 35.6 % — ABNORMAL LOW (ref 36.0–46.0)
Hemoglobin: 11.4 g/dL — ABNORMAL LOW (ref 12.0–15.0)
Lymphocytes Relative: 37.3 % (ref 12.0–46.0)
Lymphs Abs: 1.9 10*3/uL (ref 0.7–4.0)
MCHC: 31.9 g/dL (ref 30.0–36.0)
MCV: 76.4 fl — ABNORMAL LOW (ref 78.0–100.0)
Monocytes Absolute: 0.3 10*3/uL (ref 0.1–1.0)
Monocytes Relative: 6.8 % (ref 3.0–12.0)
Neutro Abs: 2.7 10*3/uL (ref 1.4–7.7)
Neutrophils Relative %: 52.6 % (ref 43.0–77.0)
Platelets: 416 10*3/uL — ABNORMAL HIGH (ref 150.0–400.0)
RBC: 4.66 Mil/uL (ref 3.87–5.11)
RDW: 14.5 % (ref 11.5–15.5)
WBC: 5.1 10*3/uL (ref 4.0–10.5)

## 2021-08-10 MED ORDER — METHOCARBAMOL 500 MG PO TABS: ORAL_TABLET | ORAL | 2 refills | Status: DC

## 2021-08-10 MED ORDER — PANTOPRAZOLE SODIUM 40 MG PO TBEC: 40.0000 mg | DELAYED_RELEASE_TABLET | Freq: Every day | ORAL | 0 refills | Status: DC

## 2021-08-10 MED ORDER — MIRTAZAPINE 15 MG PO TABS: 15.0000 mg | ORAL_TABLET | Freq: Every day | ORAL | 1 refills | Status: DC

## 2021-08-10 NOTE — Patient Instructions (Addendum)
It was great to see you!  Consider taking a break from marijuana to see if this helps with your heartburn.  Please go to the lab for blood work.   Our office will call you with your results unless you have chosen to receive results via MyChart.  If your blood work is normal we will follow-up each year for physicals and as scheduled for chronic medical problems.  If anything is abnormal we will treat accordingly and get you in for a follow-up.  Take care,  Aldona Bar

## 2021-08-12 ENCOUNTER — Encounter: Payer: Self-pay | Admitting: Gastroenterology

## 2021-08-20 ENCOUNTER — Encounter: Payer: Self-pay | Admitting: Physician Assistant

## 2021-08-23 ENCOUNTER — Other Ambulatory Visit: Payer: Self-pay | Admitting: Physician Assistant

## 2021-08-23 MED ORDER — SUCRALFATE 1 GM/10ML PO SUSP: 1.0000 g | Freq: Three times a day (TID) | ORAL | 0 refills | Status: DC

## 2021-08-24 ENCOUNTER — Ambulatory Visit (INDEPENDENT_AMBULATORY_CARE_PROVIDER_SITE_OTHER): Payer: BC Managed Care – PPO | Admitting: *Deleted

## 2021-08-24 ENCOUNTER — Encounter (HOSPITAL_BASED_OUTPATIENT_CLINIC_OR_DEPARTMENT_OTHER): Payer: Self-pay | Admitting: Obstetrics & Gynecology

## 2021-08-24 ENCOUNTER — Other Ambulatory Visit (HOSPITAL_COMMUNITY)
Admission: RE | Admit: 2021-08-24 | Discharge: 2021-08-24 | Disposition: A | Discharge status: Home / Self Care | Payer: BC Managed Care – PPO | Source: Ambulatory Visit | Attending: Obstetrics & Gynecology | Admitting: Obstetrics & Gynecology

## 2021-08-24 DIAGNOSIS — N898 Other specified noninflammatory disorders of vagina: Secondary | ICD-10-CM | POA: Diagnosis not present

## 2021-08-24 DIAGNOSIS — B3731 Acute candidiasis of vulva and vagina: Secondary | ICD-10-CM | POA: Diagnosis not present

## 2021-08-24 MED ORDER — FLUCONAZOLE 150 MG PO TABS: 150.0000 mg | ORAL_TABLET | Freq: Once | ORAL | 0 refills | Status: AC

## 2021-08-25 LAB — CERVICOVAGINAL ANCILLARY ONLY
Bacterial Vaginitis (gardnerella): NEGATIVE
Candida Glabrata: NEGATIVE
Candida Vaginitis: POSITIVE — AB
Chlamydia: NEGATIVE
Comment: NEGATIVE
Comment: NEGATIVE
Comment: NEGATIVE
Comment: NEGATIVE
Comment: NEGATIVE
Comment: NORMAL
Neisseria Gonorrhea: NEGATIVE
Trichomonas: NEGATIVE

## 2021-08-27 ENCOUNTER — Other Ambulatory Visit (HOSPITAL_BASED_OUTPATIENT_CLINIC_OR_DEPARTMENT_OTHER): Payer: Self-pay | Admitting: *Deleted

## 2021-08-27 MED ORDER — FLUCONAZOLE 150 MG PO TABS: 150.0000 mg | ORAL_TABLET | Freq: Once | ORAL | 0 refills | Status: AC

## 2021-08-27 NOTE — Progress Notes (Signed)
Rx sent to pharmacy for treatment of yeast  

## 2021-09-08 ENCOUNTER — Other Ambulatory Visit (HOSPITAL_COMMUNITY)
Admission: RE | Admit: 2021-09-08 | Discharge: 2021-09-08 | Disposition: A | Discharge status: Home / Self Care | Payer: BC Managed Care – PPO | Source: Ambulatory Visit | Attending: Obstetrics & Gynecology | Admitting: Obstetrics & Gynecology

## 2021-09-08 ENCOUNTER — Ambulatory Visit (INDEPENDENT_AMBULATORY_CARE_PROVIDER_SITE_OTHER): Payer: BC Managed Care – PPO | Admitting: *Deleted

## 2021-09-08 ENCOUNTER — Other Ambulatory Visit (HOSPITAL_BASED_OUTPATIENT_CLINIC_OR_DEPARTMENT_OTHER): Payer: Self-pay | Admitting: Obstetrics & Gynecology

## 2021-09-08 ENCOUNTER — Encounter (HOSPITAL_BASED_OUTPATIENT_CLINIC_OR_DEPARTMENT_OTHER): Payer: Self-pay | Admitting: Obstetrics & Gynecology

## 2021-09-08 ENCOUNTER — Other Ambulatory Visit: Payer: Self-pay | Admitting: Obstetrics & Gynecology

## 2021-09-08 DIAGNOSIS — N898 Other specified noninflammatory disorders of vagina: Secondary | ICD-10-CM

## 2021-09-08 DIAGNOSIS — R300 Dysuria: Secondary | ICD-10-CM

## 2021-09-08 LAB — POCT URINALYSIS DIPSTICK
Appearance: NORMAL
Bilirubin, UA: NEGATIVE
Glucose, UA: NEGATIVE
Ketones, UA: POSITIVE
Nitrite, UA: NEGATIVE
Protein, UA: POSITIVE — AB
Spec Grav, UA: 1.025 (ref 1.010–1.025)
Urobilinogen, UA: 1 E.U./dL
pH, UA: 6 (ref 5.0–8.0)

## 2021-09-08 MED ORDER — NITROFURANTOIN MONOHYD MACRO 100 MG PO CAPS
100.0000 mg | ORAL_CAPSULE | Freq: Two times a day (BID) | ORAL | 0 refills | Status: DC
Start: 2021-09-08 — End: 2021-09-29

## 2021-09-08 NOTE — Progress Notes (Signed)
Pt presents to office with complaints of burning with urination, urinary frequency and some vaginal itching. Urine culture and aptima test obtained by self swab. Rx sent to pharmacy for treatment of UTI symptoms and positive blood and leuks on urine dip. Advised that we would call her with results or if any changes need to be made.

## 2021-09-09 ENCOUNTER — Encounter (HOSPITAL_BASED_OUTPATIENT_CLINIC_OR_DEPARTMENT_OTHER): Payer: Self-pay | Admitting: Obstetrics & Gynecology

## 2021-09-09 ENCOUNTER — Other Ambulatory Visit (HOSPITAL_BASED_OUTPATIENT_CLINIC_OR_DEPARTMENT_OTHER): Payer: Self-pay | Admitting: *Deleted

## 2021-09-09 LAB — CERVICOVAGINAL ANCILLARY ONLY
Bacterial Vaginitis (gardnerella): POSITIVE — AB
Candida Glabrata: NEGATIVE
Candida Vaginitis: NEGATIVE
Comment: NEGATIVE
Comment: NEGATIVE
Comment: NEGATIVE

## 2021-09-09 MED ORDER — METRONIDAZOLE 500 MG PO TABS: 500.0000 mg | ORAL_TABLET | Freq: Two times a day (BID) | ORAL | 0 refills | Status: DC

## 2021-09-09 NOTE — Progress Notes (Signed)
Rx sent to pharmacy for treatment of BV 

## 2021-09-11 LAB — URINE CULTURE

## 2021-09-29 ENCOUNTER — Other Ambulatory Visit (INDEPENDENT_AMBULATORY_CARE_PROVIDER_SITE_OTHER): Payer: BC Managed Care – PPO

## 2021-09-29 ENCOUNTER — Ambulatory Visit (INDEPENDENT_AMBULATORY_CARE_PROVIDER_SITE_OTHER): Payer: BC Managed Care – PPO | Admitting: Gastroenterology

## 2021-09-29 VITALS — BP 118/70 | HR 80 | Ht 63.0 in | Wt 140.0 lb

## 2021-09-29 DIAGNOSIS — D509 Iron deficiency anemia, unspecified: Secondary | ICD-10-CM | POA: Diagnosis not present

## 2021-09-29 DIAGNOSIS — K219 Gastro-esophageal reflux disease without esophagitis: Secondary | ICD-10-CM

## 2021-09-29 DIAGNOSIS — R1310 Dysphagia, unspecified: Secondary | ICD-10-CM

## 2021-09-29 DIAGNOSIS — K2100 Gastro-esophageal reflux disease with esophagitis, without bleeding: Secondary | ICD-10-CM

## 2021-09-29 DIAGNOSIS — R630 Anorexia: Secondary | ICD-10-CM

## 2021-09-29 LAB — IBC + FERRITIN
Ferritin: 29.2 ng/mL (ref 10.0–291.0)
Iron: 60 ug/dL (ref 42–145)
Saturation Ratios: 14.4 % — ABNORMAL LOW (ref 20.0–50.0)
TIBC: 417.2 ug/dL (ref 250.0–450.0)
Transferrin: 298 mg/dL (ref 212.0–360.0)

## 2021-09-29 MED ORDER — PANTOPRAZOLE SODIUM 40 MG PO TBEC: 40.0000 mg | DELAYED_RELEASE_TABLET | Freq: Two times a day (BID) | ORAL | 1 refills | Status: DC

## 2021-09-29 NOTE — Progress Notes (Signed)
HPI :  41 year old female with history of dysphagia, GERD, iron deficiency, referred by Inda Coke PA for further evaluation of these issues.  This is her first time to our office, she was previously seen by Dr. Rolm Bookbinder of [facility] in years past.  She reports for the past few years she has had a lot of problems with reflux.  She endorses regurgitation if she bends over and significant pyrosis, cough and globus sensation.  She has tried changing her diet which has not really helped this too much.  Symptoms tend to be worse in the afternoon and later in the day although she did not have much nocturnal symptoms.  She has been on Protonix 40 mg once daily and initially that provided some good benefit for her, over time the effect has waned a bit and she continues to have breakthrough daily on the regimen.  She was also taking liquid Carafate a few times daily but stopped taking it as she thought it upset her stomach.  She is currently taking Tums as needed for breakthrough which does provide some temporary relief.  Reflux is her most bothersome symptom, she inquires about other options other than medications which she states do not appear to be working too well for her right now.  She is also had some dysphagia that bothers her.  She feels this localized in her throat area.  Usually occurs mostly with solids, or large gulps of liquids.  This has been ongoing for the past month or so.  She previously had this complaint back in 2021 and had an EGD.  Her esophagus was normal, no focal stricture seen, biopsies taken and ruled out EOE.  She had an empiric dilation to 17 mm and states this relieved her dysphagia until the past month or came back.  She is interested in pursuing another endoscopy with dilation to treat the symptoms.  She otherwise endorses some epigastric burning sensation that can bother her at times.  She denies any early satiety but states she is not eating as well as she should due  to poor appetite related to her reflux.  She denies any nausea or vomiting.  She is moving her bowels okay, denies any rectal bleeding.  She has lost about 10 pounds over the past 6 months or so because she has not been eating too well.  She has a great-grandmother who had colon cancer but no other first-degree relatives with colon cancer.  She has a history of iron deficiency, EGD and colonoscopy showed no clear cause back in 2021/2022 for this issue which she states is longstanding.  She denies any heavy menstrual periods, states they are actually fairly light but she does have them routinely.  She does feel fatigued from her iron deficiency.  Her last hemoglobin was 11.4 with an MCV of 76 in June. She takes her iron supplement perhaps every other day for this.  On review of labs she has had a persistent chronic microcytosis with mild anemia associated with this over time.  She does have a history of asthma although states it is well controlled and she denies any cardiopulmonary symptoms at this time.  No issues with anesthesia in the past.  Prior workup: Colonoscopy 08/20/20: Dr. Rolm Bookbinder Findings - adequate prep The Terminal Ileum was intubated for 10 cm and was normal All examined segments of the colon appeared normal. Medium Internal hemorrhoids were seen on rectal retroflection.  EGD 01/07/20: Dr. Rolm Bookbinder Normal esophagus, biopsied. Empiric dilation to  70m performed. Normal stomach. Duodenum normal. Biopsies taken.  Pathologic Diagnosis A. SMALL INTESTINE, DUODENUM, ENDOSCOPIC BIOPSY: FRAGMENTS OF UNREMARKABLE SMALL INTESTINAL MUCOSA. NO EVIDENCE OF CELIAC DISEASE.  B. ESOPHAGUS, DISTAL, ENDOSCOPIC BIOPSY: FRAGMENTS OF UNREMARKABLE ESOPHAGEAL MUCOSA. NO EVIDENCE OF EOSINOPHILIC ESOPHAGITIS.  C. ESOPHAGUS, PROXIMAL, ENDOSCOPIC BIOPSY: FRAGMENTS OF UNREMARKABLE ESOPHAGEAL MUCOSA. NO EVIDENCE OF EOSINOPHILIC ESOPHAGITIS.     Labs 08/10/21: Hgb 11.4, MCV 76 CMET  normal   Past Medical History:  Diagnosis Date   Allergy    Anxiety    COVID-19 04/27/2021   Depression    Fibroids, subserous    GERD (gastroesophageal reflux disease)    History of ovarian cyst    Hypertension    IDA (iron deficiency anemia)    Microcytic anemia    Mild persistent asthma    Wears glasses      Past Surgical History:  Procedure Laterality Date   COLONOSCOPY  08/20/2020   @ [facility]   ESOPHAGOGASTRODUODENOSCOPY  01/07/2020   @ [facility]   HYSTEROSCOPY N/A 10/13/2020   Procedure: HYSTEROSCOPY, DILATION AND CURETTAGE;  Surgeon: MMegan Salon MD;  Location: [facility]  Service: Gynecology;  Laterality: N/A;   Family History  Problem Relation Age of Onset   Hypertension Mother    Alcohol abuse Father        now in remission   Lupus Maternal Aunt    Heart disease Maternal Aunt    Depression Maternal Aunt    Colon cancer Maternal Great-grandmother    Breast cancer Neg Hx    Social History   Tobacco Use   Smoking status: Former    Types: Cigars   Smokeless tobacco: Never   Tobacco comments:    Doesn't smoke tobacco  Vaping Use   Vaping Use: Never used  Substance Use Topics   Alcohol use: Yes    Alcohol/week: 4.0 standard drinks of alcohol    Types: 2 Glasses of wine, 2 Shots of liquor per week    Comment: occasional   Drug use: Yes    Types: Marijuana    Comment: 10-08-2020  per pt smokes average 3 times per week   Current Outpatient Medications  Medication Sig Dispense Refill   albuterol (PROVENTIL) (2.5 MG/3ML) 0.083% nebulizer solution Take 3 mLs (2.5 mg total) by nebulization every 6 (six) hours as needed for wheezing or shortness of breath. 150 mL 1   albuterol (VENTOLIN HFA) 108 (90 Base) MCG/ACT inhaler Inhale 2 puffs into the lungs every 6 (six) hours as needed. 1 each 2   buPROPion (WELLBUTRIN XL) 150 MG 24 hr tablet Take 150 mg by mouth daily.     Calcium Carb-Cholecalciferol (CALCIUM + D3 PO)  Take by mouth every other day. PER PT TAKES TWO TABLESPOONS QOD     ferrous sulfate (FER-IN-SOL) 75 (15 Fe) MG/ML SOLN Take by mouth every other day. PER PT ONE OUNCE QOD     methocarbamol (ROBAXIN) 500 MG tablet TAKE 1 TO 2 TABLETS(500 TO 1000 MG) BY MOUTH EVERY 8 HOURS AS NEEDED FOR MUSCLE SPASMS OR HEADACHE OR PAIN 60 tablet 2   mirtazapine (REMERON) 15 MG tablet Take 1 tablet (15 mg total) by mouth at bedtime. 90 tablet 1   fluticasone (FLOVENT HFA) 110 MCG/ACT inhaler Inhale 2 puffs into the lungs 2 (two) times daily as needed. Prescribed twice daily but pt is only using as needed (Patient not taking: Reported on 09/29/2021) 1 each 2   ibuprofen (ADVIL) 800 MG  tablet Take 1 tablet (800 mg total) by mouth every 8 (eight) hours as needed. (Patient not taking: Reported on 09/29/2021) 30 tablet 0   pantoprazole (PROTONIX) 40 MG tablet Take 1 tablet (40 mg total) by mouth 2 (two) times daily before a meal. Take one hour before meals 180 tablet 1   sucralfate (CARAFATE) 1 GM/10ML suspension Take 10 mLs (1 g total) by mouth 4 (four) times daily -  with meals and at bedtime. (Patient not taking: Reported on 09/29/2021) 420 mL 0   No current facility-administered medications for this visit.   No Known Allergies   Review of Systems: All systems reviewed and negative except where noted in HPI.   Labs reviewed in epic as above  Physical Exam: BP 118/70   Pulse 80   Ht '5\' 3"'$  (1.6 m)   Wt 140 lb (63.5 kg)   BMI 24.80 kg/m  Constitutional: Pleasant,well-developed, female in no acute distress. HEENT: Normocephalic and atraumatic. Conjunctivae are normal. No scleral icterus. Neck supple.  Cardiovascular: Normal rate, regular rhythm.  Pulmonary/chest: Effort normal and breath sounds normal.  Abdominal: Soft, nondistended, nontender.  There are no masses palpable.  Extremities: no edema Lymphadenopathy: No cervical adenopathy noted. Neurological: Alert and oriented to person place and time. Skin: Skin  is warm and dry. No rashes noted. Psychiatric: Normal mood and affect. Behavior is normal.   ASSESSMENT: 41 y.o. female here for assessment of the following  1. Gastroesophageal reflux disease with esophagitis, unspecified whether hemorrhage   2. Dysphagia, unspecified type   3. Poor appetite   4. Iron deficiency anemia, unspecified iron deficiency anemia type    As above, history of GERD, now with recurrent dysphagia, in the setting of iron deficiency anemia and poor appetite with some weight loss lately.  She has significant reflux symptoms that bother her, previously responded to a moderate dose of Protonix but now with interval worsening.  I discussed options with her.  Given her worsening symptoms recommend escalation of PPI to 40 mg twice daily, take half hour prior to meal.  I discussed long-term risks and benefits of chronic PPI use, she understands this, feel benefits outweigh risk at this time.  That being said discussed other options to treat her reflux should she wish to come off PPI in the future.  Specifically she may be a candidate for TIF.  After discussion of this she is very interested and would prefer to treat her reflux without medications if its this bothersome for her and not working as well as it could.  Given her recurrent dysphagia and her active reflux symptoms, offered her an EGD at the [facility] to further evaluate.  Given her favorable response to dilation in the past, we will plan on dilation for this exam to treat her dysphagia.  In the interim she will take her Protonix 40 mg twice daily and will assess response.  If she is a candidate for TIF we may have her see Dr. Bryan Lemma for evaluation for that.  Otherwise, I will plan on rechecking her iron studies today to see if she is persistently iron deficient despite oral supplementation, may consider IV iron infusion if she is intolerant to higher dosing of oral iron.  Prior colonoscopy at time of IDA unremarkable. This could be  related to her menses although she denies any menorrhagia lately.  We will await her EGD, may consider capsule endoscopy pending her course.  She agrees with the plan.   PLAN: - schedule EGD at  the [facility], will plan on empiric dilation. Evaluate candidacy for TIF - increase Protonix to '40mg'$  BID - take 1/2 hr prior to meal  - check TIBC / ferritin panel - may need to increase oral iron dosing if tolerated or switch to IV iron - consider TIF evaluation pending EGD result - consider capsule endoscopy if IDA persists, perhaps due to menses otherwise. Await EGD initially  Jolly Mango, MD [facility]  CC: Inda Coke, [place]

## 2021-09-29 NOTE — Patient Instructions (Addendum)
If you are age 40 or older, your body mass index should be between 23-30. Your Body mass index is 24.8 kg/m. If this is out of the aforementioned range listed, please consider follow up with your Primary Care Provider.  If you are age 65 or younger, your body mass index should be between 19-25. Your Body mass index is 24.8 kg/m. If this is out of the aformentioned range listed, please consider follow up with your Primary Care Provider.   ________________________________________________________  The  GI providers would like to encourage you to use [facility] to communicate with providers for non-urgent requests or questions.  Due to long hold times on the telephone, sending your provider a message by [facility] may be a faster and more efficient way to get a response.  Please allow 48 business hours for a response.  Please remember that this is for non-urgent requests.  _______________________________________________________  Dennis Bast have been scheduled for an endoscopy. Please follow written instructions given to you at your visit today. If you use inhalers (even only as needed), please bring them with you on the day of your procedure.  Please go to the lab in the basement of our building to have lab work done as you leave today. Hit "B" for basement when you get on the elevator.  When the doors open the lab is on your left.  We will call you with the results. Thank you.  We have sent the following medications to your pharmacy for you to pick up at your convenience: Protonix 40 mg: Take twice daily  Thank you for entrusting me with your care and for choosing [employer], Dr. [place] Cellar

## 2021-10-17 ENCOUNTER — Other Ambulatory Visit (HOSPITAL_BASED_OUTPATIENT_CLINIC_OR_DEPARTMENT_OTHER): Payer: Self-pay | Admitting: Obstetrics & Gynecology

## 2021-10-18 ENCOUNTER — Encounter: Payer: Self-pay | Admitting: Gastroenterology

## 2021-10-18 ENCOUNTER — Ambulatory Visit (AMBULATORY_SURGERY_CENTER): Payer: BC Managed Care – PPO | Admitting: Gastroenterology

## 2021-10-18 VITALS — BP 160/94 | HR 66 | Temp 98.4°F | Resp 15 | Ht 63.0 in | Wt 140.0 lb

## 2021-10-18 DIAGNOSIS — K219 Gastro-esophageal reflux disease without esophagitis: Secondary | ICD-10-CM

## 2021-10-18 DIAGNOSIS — K317 Polyp of stomach and duodenum: Secondary | ICD-10-CM | POA: Diagnosis not present

## 2021-10-18 DIAGNOSIS — R1310 Dysphagia, unspecified: Secondary | ICD-10-CM

## 2021-10-18 DIAGNOSIS — K31A0 Gastric intestinal metaplasia, unspecified: Secondary | ICD-10-CM

## 2021-10-18 DIAGNOSIS — K3189 Other diseases of stomach and duodenum: Secondary | ICD-10-CM | POA: Diagnosis not present

## 2021-10-18 LAB — POCT URINE PREGNANCY: Preg Test, Ur: NEGATIVE

## 2021-10-18 MED ORDER — SODIUM CHLORIDE 0.9 % IV SOLN: 500.0000 mL | INTRAVENOUS | Status: DC

## 2021-10-18 NOTE — Progress Notes (Signed)
History and Physical Interval Note: Patient seen on 09/29/21 - no interval changes. On protonix '40mg'$  BID at this point, still having a lot of GERD and dysphagia which appears unchanged. I have discussed risks / benefits and she wishes to proceed with EGD and likely dilation of her esophagus. Otherwise denies cardiopulmonary symptoms and feels at baseline  10/18/2021 2:42 PM  Erika Frazier  has presented today for endoscopic procedure(s), with the diagnosis of  Encounter Diagnoses  Name Primary?   Gastroesophageal reflux disease, unspecified whether esophagitis present Yes   Dysphagia, unspecified type   .  The various methods of evaluation and treatment have been discussed with the patient and/or family. After consideration of risks, benefits and other options for treatment, the patient has consented to  the endoscopic procedure(s).   The patient's history has been reviewed, patient examined, no change in status, stable for surgery.  I have reviewed the patient's chart and labs.  Questions were answered to the patient's satisfaction.    Jolly Mango, MD [facility]

## 2021-10-18 NOTE — Op Note (Signed)
[facility] Patient Name: Erika Frazier Procedure Date: 10/18/2021 2:40 PM MRN: [id number] Endoscopist: Remo Lipps P. Havery Moros , MD Age: 41 Referring MD:  Date of Birth: Jun 22, 1980 Gender: Female Account #: [id number] Procedure:                Upper GI endoscopy Indications:              Dysphagia, follow-up of GERD - on protonix '40mg'$                             twice daily and still with significant reflux                            symptoms. Dysphagia previously responded well to                            empiric dilation. Prior biopsies ruled out EoE Medicines:                Monitored Anesthesia Care Procedure:                Pre-Anesthesia Assessment:                           - Prior to the procedure, a History and Physical                            was performed, and patient medications and                            allergies were reviewed. The patient's tolerance of                            previous anesthesia was also reviewed. The risks                            and benefits of the procedure and the sedation                            options and risks were discussed with the patient.                            All questions were answered, and informed consent                            was obtained. Prior Anticoagulants: The patient has                            taken no previous anticoagulant or antiplatelet                            agents. ASA Grade Assessment: II - A patient with                            mild systemic disease. After reviewing the risks  and benefits, the patient was deemed in                            satisfactory condition to undergo the procedure.                           After obtaining informed consent, the endoscope was                            passed under direct vision. Throughout the                            procedure, the patient's blood pressure, pulse, and                            oxygen  saturations were monitored continuously. The                            Endoscope was introduced through the mouth, and                            advanced to the second part of duodenum. The upper                            GI endoscopy was accomplished without difficulty.                            The patient tolerated the procedure well. Scope In: Scope Out: Findings:                 Esophagogastric landmarks were identified: the                            Z-line was found at 40 cm, the gastroesophageal                            junction was found at 40 cm and the upper extent of                            the gastric folds was found at 40 cm from the                            incisors.                           The gastroesophageal flap valve was visualized                            endoscopically and classified as Hill Grade III.                           The exam of the esophagus was otherwise normal. No  inflammatory changes, no erosive change, no                            stricture / stenosis.                           A guidewire was placed and the scope was withdrawn.                            Empiric dilation was performed in the entire                            esophagus with a Savary dilator with mild                            resistance at 17 mm. Relook endoscopy showed an                            appropriate mucosal wrent at the upper esophagus                            just inferior to the UES.                           The entire examined stomach was normal.                           A single 3 mm sessile polyp was found in the                            duodenal bulb. The polyp was removed with a cold                            biopsy forceps. Resection and retrieval were                            complete.                           The exam of the duodenum was otherwise normal. Complications:            No immediate complications.  Estimated blood loss:                            Minimal. Estimated Blood Loss:     Estimated blood loss was minimal. Impression:               - Esophagogastric landmarks identified.                           - Gastroesophageal flap valve classified as Hill                            Grade III (minimal fold, loose to endoscope).                           -  Normal esophagus otherwise - empiric dilation                            performed to 78m with an appropriate mucosal wrent                            noted in the proximal esophagus. Likely subtle                            stricture just inferior to the UES for cause of                            dysphagia.                           - Normal stomach.                           - A single duodenal polyp. Resected and retrieved.                           - Normal duodenum otherwise.                           Patient likely has nonerosive reflux disease (NERD)                            as cause of symptoms Recommendation:           - Patient has a contact number available for                            emergencies. The signs and symptoms of potential                            delayed complications were discussed with the                            patient. Return to normal activities tomorrow.                            Written discharge instructions were provided to the                            patient.                           - Post dilation diet                           - Continue present medications.                           - Consider switch of protonix to omeprazole '40mg'$  or                            esomeprazole  $'40mg's$  BID if she has not tried one of                            those options                           - Await pathology results.                           - If reflux symptoms persist, consideration for                            intervention for reflux treatment. Would need                            barium  study or pH study to confirm reflux is                            causing symptoms prior to any intevention. If one                            of those tests were positive for GERD, may need to                            consider Nissen fundoplication given Hill grade III                            views of GEJ however can discuss her candidacy for                            TIF with Dr. Rica Frazier P. Alexandr Yaworski, MD 10/18/2021 3:07:04 PM This report has been signed electronically.

## 2021-10-18 NOTE — Patient Instructions (Signed)
HANDOUT ON POST DILATATION DIET GIVEN.   YOU HAD AN ENDOSCOPIC PROCEDURE TODAY AT [facility]:   Refer to the procedure report that was given to you for any specific questions about what was found during the examination.  If the procedure report does not answer your questions, please call your gastroenterologist to clarify.  If you requested that your care partner not be given the details of your procedure findings, then the procedure report has been included in a sealed envelope for you to review at your convenience later.  YOU SHOULD EXPECT: Some feelings of bloating in the abdomen. Passage of more gas than usual.  Walking can help get rid of the air that was put into your GI tract during the procedure and reduce the bloating. If you had a lower endoscopy (such as a colonoscopy or flexible sigmoidoscopy) you may notice spotting of blood in your stool or on the toilet paper. If you underwent a bowel prep for your procedure, you may not have a normal bowel movement for a few days.  Please Note:  You might notice some irritation and congestion in your nose or some drainage.  This is from the oxygen used during your procedure.  There is no need for concern and it should clear up in a day or so.  SYMPTOMS TO REPORT IMMEDIATELY:  Following upper endoscopy (EGD)  Vomiting of blood or coffee ground material  New chest pain or pain under the shoulder blades  Painful or persistently difficult swallowing  New shortness of breath  Fever of 100F or higher  Black, tarry-looking stools  For urgent or emergent issues, a gastroenterologist can be reached at any hour by calling [phone]. Do not use MyChart messaging for urgent concerns.    DIET:  We do recommend a small meal at first, but then you may proceed to your regular diet.  Drink plenty of fluids but you should avoid alcoholic beverages for 24 hours.  ACTIVITY:  You should plan to take it easy for the rest of today and you  should NOT DRIVE or use heavy machinery until tomorrow (because of the sedation medicines used during the test).    FOLLOW UP: Our staff will call the number listed on your records the next business day following your procedure.  We will call around 7:15- 8:00 am to check on you and address any questions or concerns that you may have regarding the information given to you following your procedure. If we do not reach you, we will leave a message.  If you develop any symptoms (ie: fever, flu-like symptoms, shortness of breath, cough etc.) before then, please call [phone].  If you test positive for Covid 19 in the 2 weeks post procedure, please call and report this information to [name].    If any biopsies were taken you will be contacted by phone or by letter within the next 1-3 weeks.  Please call us at [phone] if you have not heard about the biopsies in 3 weeks.    SIGNATURES/CONFIDENTIALITY: You and/or your care partner have signed paperwork which will be entered into your electronic medical record.  These signatures attest to the fact that that the information above on your After Visit Summary has been reviewed and is understood.  Full responsibility of the confidentiality of this discharge information lies with you and/or your care-partner.

## 2021-10-18 NOTE — Progress Notes (Signed)
Report to PACU, RN, vss, BBS= Clear.  

## 2021-10-18 NOTE — Progress Notes (Signed)
Pt's states no medical or surgical changes since previsit or office visit. 

## 2021-10-19 ENCOUNTER — Telehealth: Payer: Self-pay

## 2021-10-19 NOTE — Telephone Encounter (Signed)
Left message on answering machine. 

## 2021-10-20 ENCOUNTER — Encounter: Payer: Self-pay | Admitting: Gastroenterology

## 2021-10-20 MED ORDER — OMEPRAZOLE 40 MG PO CPDR: 40.0000 mg | DELAYED_RELEASE_CAPSULE | Freq: Two times a day (BID) | ORAL | 1 refills | Status: DC

## 2021-10-20 NOTE — Telephone Encounter (Signed)
Dr. Havery Moros,  Per recent procedure report - "Consider switch of protonix to omeprazole '40mg'$  or esomeprazole '40mg'$  BID if she has not tried one of those options". Which RX would you like sent in?

## 2021-10-20 NOTE — Telephone Encounter (Signed)
Thanks.  If I recall in my discussion with the patient we were going to do omeprazole 40 mg twice daily.  I apologize if that was not already in the system yet, can you please help with that?  Thanks

## 2021-10-24 ENCOUNTER — Encounter (HOSPITAL_BASED_OUTPATIENT_CLINIC_OR_DEPARTMENT_OTHER): Payer: Self-pay | Admitting: Obstetrics & Gynecology

## 2021-10-29 ENCOUNTER — Other Ambulatory Visit (HOSPITAL_COMMUNITY)
Admission: RE | Admit: 2021-10-29 | Discharge: 2021-10-29 | Disposition: A | Discharge status: Home / Self Care | Payer: BC Managed Care – PPO | Source: Ambulatory Visit | Attending: Obstetrics & Gynecology | Admitting: Obstetrics & Gynecology

## 2021-10-29 ENCOUNTER — Ambulatory Visit (INDEPENDENT_AMBULATORY_CARE_PROVIDER_SITE_OTHER): Payer: BC Managed Care – PPO | Admitting: *Deleted

## 2021-10-29 ENCOUNTER — Other Ambulatory Visit (HOSPITAL_BASED_OUTPATIENT_CLINIC_OR_DEPARTMENT_OTHER): Payer: Self-pay | Admitting: Obstetrics & Gynecology

## 2021-10-29 DIAGNOSIS — R300 Dysuria: Secondary | ICD-10-CM | POA: Diagnosis not present

## 2021-10-29 DIAGNOSIS — N898 Other specified noninflammatory disorders of vagina: Secondary | ICD-10-CM | POA: Insufficient documentation

## 2021-10-29 DIAGNOSIS — Z113 Encounter for screening for infections with a predominantly sexual mode of transmission: Secondary | ICD-10-CM | POA: Insufficient documentation

## 2021-10-29 DIAGNOSIS — R319 Hematuria, unspecified: Secondary | ICD-10-CM | POA: Diagnosis not present

## 2021-10-29 LAB — POCT URINALYSIS DIPSTICK
Appearance: NORMAL
Bilirubin, UA: NEGATIVE
Glucose, UA: POSITIVE — AB
Nitrite, UA: POSITIVE
Protein, UA: POSITIVE — AB
Spec Grav, UA: 1.025 (ref 1.010–1.025)
Urobilinogen, UA: 1 E.U./dL
pH, UA: 6.5 (ref 5.0–8.0)

## 2021-10-29 MED ORDER — SULFAMETHOXAZOLE-TRIMETHOPRIM 800-160 MG PO TABS: 1.0000 | ORAL_TABLET | Freq: Two times a day (BID) | ORAL | 0 refills | Status: DC

## 2021-10-29 NOTE — Progress Notes (Signed)
Pt with complaints of burning with urination, urinary frequency and vaginal discharge. Urine obtained, positive for nitrates. Rx sent for treatment of uti. Advised we would let her know of results for aptima once received.

## 2021-10-29 NOTE — Progress Notes (Signed)
Rx for bactrim DS Bid x 5 days sent to pharmacy.

## 2021-10-31 LAB — URINE CULTURE: Organism ID, Bacteria: NO GROWTH

## 2021-11-02 ENCOUNTER — Ambulatory Visit (HOSPITAL_BASED_OUTPATIENT_CLINIC_OR_DEPARTMENT_OTHER): Payer: BC Managed Care – PPO

## 2021-11-02 LAB — CERVICOVAGINAL ANCILLARY ONLY
Bacterial Vaginitis (gardnerella): NEGATIVE
Candida Glabrata: NEGATIVE
Candida Vaginitis: NEGATIVE
Chlamydia: NEGATIVE
Comment: NEGATIVE
Comment: NEGATIVE
Comment: NEGATIVE
Comment: NEGATIVE
Comment: NEGATIVE
Comment: NORMAL
Neisseria Gonorrhea: NEGATIVE
Trichomonas: NEGATIVE

## 2021-11-15 ENCOUNTER — Ambulatory Visit (INDEPENDENT_AMBULATORY_CARE_PROVIDER_SITE_OTHER): Payer: BC Managed Care – PPO | Admitting: Physician Assistant

## 2021-11-15 ENCOUNTER — Encounter: Payer: Self-pay | Admitting: Physician Assistant

## 2021-11-15 ENCOUNTER — Telehealth: Payer: Self-pay | Admitting: Gastroenterology

## 2021-11-15 VITALS — BP 138/90 | HR 88 | Temp 98.1°F | Ht 63.0 in | Wt 134.0 lb

## 2021-11-15 DIAGNOSIS — R1013 Epigastric pain: Secondary | ICD-10-CM | POA: Diagnosis not present

## 2021-11-15 DIAGNOSIS — R519 Headache, unspecified: Secondary | ICD-10-CM | POA: Diagnosis not present

## 2021-11-15 DIAGNOSIS — R1310 Dysphagia, unspecified: Secondary | ICD-10-CM

## 2021-11-15 LAB — POC INFLUENZA A&B (BINAX/QUICKVUE)
Influenza A, POC: NEGATIVE
Influenza B, POC: NEGATIVE

## 2021-11-15 MED ORDER — SUCRALFATE 1 GM/10ML PO SUSP
1.0000 g | Freq: Four times a day (QID) | ORAL | 0 refills | Status: DC | PRN
Start: 2021-11-15 — End: 2021-12-14

## 2021-11-15 NOTE — Telephone Encounter (Signed)
Inbound call from patient states she is having chest pain and her throat is still bothering her from previous egd. Sch'd to see Dr. Havery Moros 10/17 but states she cant wait that long. Please advise.

## 2021-11-15 NOTE — Telephone Encounter (Signed)
Sorry to hear this. Hopefully no breathing problems, etc. Her procedure was a month ago so I would doubt a complication from her exam. Hopefully her dysphagia has improved with the dilation. I would like to add liquid carafate 10cc po every 6 hours PRN to see if that will help and would proceed with a barium swallow with tablet to further evaluate her motility if you can order that. Can place on wait list for clinic opening and we can call her if any cancellations in the interim. Thanks

## 2021-11-15 NOTE — Patient Instructions (Addendum)
It was great to see you!  I think you had a viral illness  Please trial excedrin migraine (or generic equivalent) if this happens again or if you need medication now  If you can, keep an eye on your blood pressure or come back and see me in a month to recheck -- sometimes this can cause pain  Please follow-up with your stomach doctor about your ongoing symptoms   Headache Precautions: Contact a doctor if: Your symptoms are not helped by medicine. You have a headache that feels different than the other headaches. You feel sick to your stomach (nauseous) or you throw up (vomit). You have a fever. Get help right away if: Your headache gets very bad quickly. Your headache gets worse after a lot of physical activity. You keep throwing up. You have a stiff neck. You have trouble seeing. You have trouble speaking. You have pain in the eye or ear. Your muscles are weak or you lose muscle control. You lose your balance or have trouble walking. You feel like you will pass out (faint) or you pass out. You are mixed up (confused). You have a seizure.

## 2021-11-15 NOTE — Telephone Encounter (Signed)
Called and spoke with patient regarding Dr. Doyne Keel recommendations. Pt denies any SOB or breathing concerns. Pt would like RX sent to [facility] on file. Pt would like to proceed with barium swallow study. Pt knows to expect a call from radiology scheduling to set up her appt. Pt has the next available appt with Dr. Havery Moros. Offered pt a sooner appt with APP but patient prefers to see Dr. Havery Moros directly. Pt will keep f/u as scheduled. Pt verbalized understanding of all information and had no concerns at the end of the call.   Barium swallow study order in epic. Secure staff message sent to radiology scheduling to contact pt to set up her appt.   RX for Carafate sent to pharmacy on file.

## 2021-11-15 NOTE — Telephone Encounter (Signed)
Returned call to patient. She reports that she has been having chest pain since her procedure. She feels like "something is stuck there". Patient states that the pain is in the center of her chest and it is constant. Pt states that she is taking Omeprazole 40 mg BID before meals with no relief. Pt also reports that her throat still hurts after the procedure. Pt states that she has been trying to eat solid foods, but it is hard to swallow. Pt stated several times that this was her second endoscopy and she did not feel like this after her first one. Please advise, thanks.

## 2021-11-15 NOTE — Progress Notes (Signed)
Erika Frazier is a 41 y.o. female here for a follow up of a pre-existing problem.  History of Present Illness:   Chief Complaint  Patient presents with   Headache    Pt c/o headache started on Saturday, sensitive to light and eyes hurt, chills no fever. COVID test Neg. Sunday still having headache on right temporal area. Headache still present.    HPI  Headache Went to work on Saturday and didn't feel 100%. As the day went on she gradually developed HA. Had light sensitivity, eye strain with computer use. Started having chills, had to put on a jacket. She is overdue to go to the eye doctor but denies significant blurred vision. She had significant diarrhea and slight nausea.  She had some ribs on Friday night. Denies sick contacts. Yesterday had some ongoing headache.  Left her shift about 4 hours early on Saturday and missed work on Sunday. Saturday had 9/10 pain. Pain is now 4/10. She was able to eat some yesterday.   COVID test negative  Took: Nyquil, tea w/ vit c and zinc,   Denies: weakness, worst HA of life, fever, loss of vision, double vision   Epigastric abdominal pain She reports that she had upper endoscopy 10/18/21. Has had these in the past. Has had ongoing stomach pain since procedure. Has not contacted GI about this.      Past Medical History:  Diagnosis Date   Allergy    Anxiety    COVID-19 04/27/2021   Depression    Fibroids, subserous    GERD (gastroesophageal reflux disease)    History of ovarian cyst    Hypertension    IDA (iron deficiency anemia)    Microcytic anemia    Mild persistent asthma    Wears glasses      Social History   Tobacco Use   Smoking status: Former    Types: Cigars   Smokeless tobacco: Never   Tobacco comments:    Doesn't smoke tobacco  Vaping Use   Vaping Use: Never used  Substance Use Topics   Alcohol use: Yes    Alcohol/week: 4.0 standard drinks of alcohol    Types: 2 Glasses of wine, 2 Shots of liquor per week     Comment: occasional   Drug use: Yes    Types: Marijuana    Comment: 10-08-2020  per pt smokes average 3 times per week    Past Surgical History:  Procedure Laterality Date   COLONOSCOPY  08/20/2020   @ [facility]   ESOPHAGOGASTRODUODENOSCOPY  01/07/2020   @ [facility]   HYSTEROSCOPY N/A 10/13/2020   Procedure: HYSTEROSCOPY, DILATION AND CURETTAGE;  Surgeon: Megan Salon, MD;  Location: [facility];  Service: Gynecology;  Laterality: N/A;    Family History  Problem Relation Age of Onset   Hypertension Mother    Alcohol abuse Father        now in remission   Lupus Maternal Aunt    Heart disease Maternal Aunt    Depression Maternal Aunt    Colon cancer Maternal Great-grandmother    Breast cancer Neg Hx     No Known Allergies  Current Medications:   Current Outpatient Medications:    albuterol (PROVENTIL) (2.5 MG/3ML) 0.083% nebulizer solution, Take 3 mLs (2.5 mg total) by nebulization every 6 (six) hours as needed for wheezing or shortness of breath., Disp: 150 mL, Rfl: 1   albuterol (VENTOLIN HFA) 108 (90 Base) MCG/ACT inhaler, Inhale 2 puffs into  the lungs every 6 (six) hours as needed., Disp: 1 each, Rfl: 2   buPROPion (WELLBUTRIN XL) 150 MG 24 hr tablet, Take 150 mg by mouth daily., Disp: , Rfl:    Calcium Carb-Cholecalciferol (CALCIUM + D3 PO), Take by mouth every other day. PER PT TAKES TWO TABLESPOONS QOD, Disp: , Rfl:    ferrous sulfate (FER-IN-SOL) 75 (15 Fe) MG/ML SOLN, Take by mouth every other day. PER PT ONE OUNCE QOD, Disp: , Rfl:    fluticasone (FLOVENT HFA) 110 MCG/ACT inhaler, Inhale 2 puffs into the lungs 2 (two) times daily as needed. Prescribed twice daily but pt is only using as needed, Disp: 1 each, Rfl: 2   ibuprofen (ADVIL) 800 MG tablet, Take 1 tablet (800 mg total) by mouth every 8 (eight) hours as needed., Disp: 30 tablet, Rfl: 0   methocarbamol (ROBAXIN) 500 MG tablet, TAKE 1 TO 2 TABLETS(500 TO 1000 MG) BY MOUTH  EVERY 8 HOURS AS NEEDED FOR MUSCLE SPASMS OR HEADACHE OR PAIN, Disp: 60 tablet, Rfl: 2   mirtazapine (REMERON) 15 MG tablet, Take 1 tablet (15 mg total) by mouth at bedtime., Disp: 90 tablet, Rfl: 1   omeprazole (PRILOSEC) 40 MG capsule, Take 1 capsule (40 mg total) by mouth 2 (two) times daily before a meal., Disp: 180 capsule, Rfl: 1   Review of Systems:   ROS Negative unless otherwise specified per HPI.  Vitals:   Vitals:   11/15/21 1449  BP: (!) 130/90  Pulse: 85  Temp: 98.1 F (36.7 C)  TempSrc: Temporal  SpO2: 97%  Weight: 134 lb (60.8 kg)  Height: '5\' 3"'$  (1.6 m)     Body mass index is 23.74 kg/m.  Physical Exam:   Physical Exam Vitals and nursing note reviewed.  Constitutional:      General: She is not in acute distress.    Appearance: She is well-developed. She is not ill-appearing or toxic-appearing.  Cardiovascular:     Rate and Rhythm: Normal rate and regular rhythm.     Pulses: Normal pulses.     Heart sounds: Normal heart sounds, S1 normal and S2 normal.  Pulmonary:     Effort: Pulmonary effort is normal.     Breath sounds: Normal breath sounds.  Skin:    General: Skin is warm and dry.  Neurological:     General: No focal deficit present.     Mental Status: She is alert.     GCS: GCS eye subscore is 4. GCS verbal subscore is 5. GCS motor subscore is 6.     Cranial Nerves: Cranial nerves 2-12 are intact.     Sensory: Sensation is intact.     Motor: Motor function is intact.     Coordination: Coordination is intact.     Gait: Gait is intact.  Psychiatric:        Speech: Speech normal.        Behavior: Behavior normal. Behavior is cooperative.    Results for orders placed or performed in visit on 11/15/21  POC Influenza A&B (Binax test)  Result Value Ref Range   Influenza A, POC Negative Negative   Influenza B, POC Negative Negative    Assessment and Plan:   Acute nonintractable headache, unspecified headache type No red flags on exam Suspect  possible viral illness associated with HA She does have mildly elevated BP, I recommend that she keep an eye on this and she reach out to [name] if BP remains > 130/90 or if she has  other concerns Will trial to limit NSAIDs due to recent endoscopy and current GI pain Will also trial excedrin migraine She might pursue FMLA -- I discussed that if she requires more than 2 days off per month I will refer to neuro Worsening precautions advised  Epigastric pain No red flags on exam or evidence of acute abdomen I recommend reaching out to GI re: ongoing abd pain  Inda Coke, PA-C

## 2021-11-23 ENCOUNTER — Ambulatory Visit (HOSPITAL_COMMUNITY)
Admission: RE | Admit: 2021-11-23 | Discharge: 2021-11-23 | Disposition: A | Discharge status: Home / Self Care | Payer: BC Managed Care – PPO | Source: Ambulatory Visit | Attending: Gastroenterology | Admitting: Gastroenterology

## 2021-11-23 DIAGNOSIS — R1310 Dysphagia, unspecified: Secondary | ICD-10-CM | POA: Diagnosis not present

## 2021-11-26 ENCOUNTER — Telehealth: Payer: Self-pay | Admitting: Physician Assistant

## 2021-11-26 NOTE — Telephone Encounter (Signed)
..  Type of form received:FMLA FORMS   Additional comments: received by fax   Received by: [employer]  Form should be Faxed to: [phone]  Form should be mailed to:  NA   Is patient requesting call for pickup: NA    Form placed:  [facility] folder   Attach charge sheet.  Yes   Individual made aware of 3-5 business day turn around (Y/N)?   NO

## 2021-11-30 ENCOUNTER — Encounter: Payer: Self-pay | Admitting: Physician Assistant

## 2021-11-30 NOTE — Telephone Encounter (Signed)
Left message on voicemail to call office.  

## 2021-11-30 NOTE — Telephone Encounter (Signed)
Pt called back, asked her how many days a month are you needing off for you FMLA? Pt said 2 days a month. Told her okay, if you need more than 2 days a month you will need to see Neurology. I will fill out your FMLA and fax it over to [employer]. Pt verbalized understanding.

## 2021-12-01 ENCOUNTER — Encounter: Payer: Self-pay | Admitting: Gastroenterology

## 2021-12-01 NOTE — Telephone Encounter (Signed)
FMLA paperwork completed and faxed to [employer].

## 2021-12-14 ENCOUNTER — Encounter: Payer: Self-pay | Admitting: Gastroenterology

## 2021-12-14 ENCOUNTER — Ambulatory Visit (INDEPENDENT_AMBULATORY_CARE_PROVIDER_SITE_OTHER): Payer: BC Managed Care – PPO | Admitting: Gastroenterology

## 2021-12-14 VITALS — BP 128/68 | HR 92 | Ht 62.0 in | Wt 135.2 lb

## 2021-12-14 DIAGNOSIS — R1013 Epigastric pain: Secondary | ICD-10-CM

## 2021-12-14 DIAGNOSIS — R1310 Dysphagia, unspecified: Secondary | ICD-10-CM

## 2021-12-14 DIAGNOSIS — K219 Gastro-esophageal reflux disease without esophagitis: Secondary | ICD-10-CM

## 2021-12-14 DIAGNOSIS — R630 Anorexia: Secondary | ICD-10-CM

## 2021-12-14 MED ORDER — OMEPRAZOLE 40 MG PO CPDR: 40.0000 mg | DELAYED_RELEASE_CAPSULE | Freq: Two times a day (BID) | ORAL | 2 refills | Status: DC

## 2021-12-14 MED ORDER — SUCRALFATE 1 GM/10ML PO SUSP: 1.0000 g | Freq: Four times a day (QID) | ORAL | 3 refills | Status: DC | PRN

## 2021-12-14 NOTE — Patient Instructions (Addendum)
_______________________________________________________  If you are age 40 or older, your body mass index should be between 23-30. Your Body mass index is 24.74 kg/m. If this is out of the aforementioned range listed, please consider follow up with your Primary Care Provider.  If you are age 12 or younger, your body mass index should be between 19-25. Your Body mass index is 24.74 kg/m. If this is out of the aformentioned range listed, please consider follow up with your Primary Care Provider.   ________________________________________________________  The [facility] providers would like to encourage you to use [facility] to communicate with providers for non-urgent requests or questions.  Due to long hold times on the telephone, sending your provider a message by [facility] may be a faster and more efficient way to get a response.  Please allow 48 business hours for a response.  Please remember that this is for non-urgent requests.  _______________________________________________________  We have sent the following medications to your pharmacy for you to pick up at your convenience:  Omeprazole, Carafate.  Continue Remeron at night  You may use FD Donald Prose as needed  Please follow up in 6 months

## 2021-12-14 NOTE — Progress Notes (Signed)
HPI :  41 year old female here for a follow-up of GERD, dysphagia, iron deficiency.  I initially saw her back in August.  Recall she has had symptoms of reflux for some time now.  She had symptoms of regurgitation, pyrosis, cough, and globus.  She had also had some intermittent dysphagia that localized to her throat area.  She had previously been followed by Dr. Rolm Bookbinder of [facility] in recent years, had an EGD and colonoscopy with him as below, had empiric dilation to 34m previously which relieved her dysphagia for a period of time. Biopsies ruled out EoE. She also has had some epigastric "burning", no early satiety but some associated poor appetite.  She had been on Protonix the last time I saw her.  I performed an EGD for her at the end of August.  There is no focal stenosis or stricture but empiric dilation performed and appeared to cause a small mucosal rent in the upper esophagus just below the UES, at site of suspected mild stenosis.  There was no significant hiatal hernia but she did have Hill grade 3 gastroesophageal flap on retroflexed view.  I switched her pantoprazole to omeprazole 40 mg twice daily.  Added back some liquid Carafate to use as needed for some symptoms she was having post dilation.  Generally she states she has been doing better than she has been on her current regimen.  She is not having as much reflux symptoms, her globus is improved.  Her dysphagia is improved.  She still has an appetite that is not as good as it used to be but generally she is feeling better.  She has been altering her diet to try and reduce the symptoms as well and avoid trigger foods.  She was given Remeron by her primary care to stimulate her appetite, she does not take it routinely.  She continues to have some dyspepsia/burning sensation in her stomach.  We have discussed options with her and she is not really interested in pursuing surgery or TIF at this point time if the medicines are working.   She had a barium swallow since have last seen her which showed no dysmotility.  Regarding her IDA recall EGD and colonoscopy showed no clear cause back in 2021/2022 for this issue which she states is longstanding.  She denies any heavy menstrual periods, states they are actually fairly light but she does have them routinely.  She takes oral iron. On review of labs she has had a persistent chronic microcytosis with mild anemia associated with this over time.  We checked her iron studies at her last visit which looked pretty good other than a very mildly low iron saturation.    Prior workup: Colonoscopy 08/20/20: Dr. BRolm BookbinderFindings - adequate prep The Terminal Ileum was intubated for 10 cm and was normal All examined segments of the colon appeared normal. Medium Internal hemorrhoids were seen on rectal retroflection.   EGD 01/07/20: Dr. BRolm BookbinderNormal esophagus, biopsied. Empiric dilation to 161mperformed. Normal stomach. Duodenum normal. Biopsies taken.   Pathologic Diagnosis A. SMALL INTESTINE, DUODENUM, ENDOSCOPIC BIOPSY: FRAGMENTS OF UNREMARKABLE SMALL INTESTINAL MUCOSA. NO EVIDENCE OF CELIAC DISEASE.  B. ESOPHAGUS, DISTAL, ENDOSCOPIC BIOPSY: FRAGMENTS OF UNREMARKABLE ESOPHAGEAL MUCOSA. NO EVIDENCE OF EOSINOPHILIC ESOPHAGITIS.  C. ESOPHAGUS, PROXIMAL, ENDOSCOPIC BIOPSY: FRAGMENTS OF UNREMARKABLE ESOPHAGEAL MUCOSA. NO EVIDENCE OF EOSINOPHILIC ESOPHAGITIS.      EGD 10/18/21: - The gastroesophageal flap valve was visualized endoscopically and classified as Hill Grade III. - The exam of the esophagus  was otherwise normal. No inflammatory changes, no erosive change, no stricture / stenosis. - A guidewire was placed and the scope was withdrawn. Empiric dilation was performed in the entire esophagus with a Savary dilator with mild resistance at 17 mm. Relook endoscopy showed an appropriate mucosal wrent at the upper esophagus just inferior to the UES. - The entire examined  stomach was normal. - A single 3 mm sessile polyp was found in the duodenal bulb. The polyp was removed with a cold biopsy forceps. Resection and retrieval were complete. - The exam of the duodenum was otherwise normal.  Surgical [P], duodenal polyp - SUPERFICIAL FRAGMENTS OF DUODENAL MUCOSA WITH EXTENSIVE GASTRIC SURFACE FOVEOLAR METAPLASIA, SEE NOTE - NEGATIVE FOR DYSPLASIA OR MALIGNANCY IN SUBMITTED MATERIAL  Barium swallow 11/23/21: IMPRESSION: Normal esophagram      Past Medical History:  Diagnosis Date   Allergy    Anxiety    COVID-19 04/27/2021   Depression    Fibroids, subserous    GERD (gastroesophageal reflux disease)    History of ovarian cyst    Hypertension    IDA (iron deficiency anemia)    Microcytic anemia    Mild persistent asthma    Wears glasses      Past Surgical History:  Procedure Laterality Date   COLONOSCOPY  08/20/2020   @ [facility]   ESOPHAGOGASTRODUODENOSCOPY  01/07/2020   @ [facility]   HYSTEROSCOPY N/A 10/13/2020   Procedure: HYSTEROSCOPY, DILATION AND CURETTAGE;  Surgeon: Megan Salon, MD;  Location: [facility];  Service: Gynecology;  Laterality: N/A;   Family History  Problem Relation Age of Onset   Hypertension Mother    Alcohol abuse Father        now in remission   Lupus Maternal Aunt    Heart disease Maternal Aunt    Depression Maternal Aunt    Colon cancer Maternal Great-grandmother    Breast cancer Neg Hx    Social History   Tobacco Use   Smoking status: Former    Types: Cigars   Smokeless tobacco: Never   Tobacco comments:    Doesn't smoke tobacco  Vaping Use   Vaping Use: Never used  Substance Use Topics   Alcohol use: Yes    Alcohol/week: 4.0 standard drinks of alcohol    Types: 2 Glasses of wine, 2 Shots of liquor per week    Comment: occasional   Drug use: Yes    Types: Marijuana    Comment: 10-08-2020  per pt smokes average 3 times per week   Current Outpatient  Medications  Medication Sig Dispense Refill   albuterol (PROVENTIL) (2.5 MG/3ML) 0.083% nebulizer solution Take 3 mLs (2.5 mg total) by nebulization every 6 (six) hours as needed for wheezing or shortness of breath. 150 mL 1   albuterol (VENTOLIN HFA) 108 (90 Base) MCG/ACT inhaler Inhale 2 puffs into the lungs every 6 (six) hours as needed. 1 each 2   buPROPion (WELLBUTRIN XL) 150 MG 24 hr tablet Take 150 mg by mouth daily.     Calcium Carb-Cholecalciferol (CALCIUM + D3 PO) Take by mouth every other day. PER PT TAKES TWO TABLESPOONS QOD     ferrous sulfate (FER-IN-SOL) 75 (15 Fe) MG/ML SOLN Take by mouth every other day. PER PT ONE OUNCE QOD     fluticasone (FLOVENT HFA) 110 MCG/ACT inhaler Inhale 2 puffs into the lungs 2 (two) times daily as needed. Prescribed twice daily but pt is only using as needed 1  each 2   ibuprofen (ADVIL) 800 MG tablet Take 1 tablet (800 mg total) by mouth every 8 (eight) hours as needed. 30 tablet 0   methocarbamol (ROBAXIN) 500 MG tablet TAKE 1 TO 2 TABLETS(500 TO 1000 MG) BY MOUTH EVERY 8 HOURS AS NEEDED FOR MUSCLE SPASMS OR HEADACHE OR PAIN 60 tablet 2   mirtazapine (REMERON) 15 MG tablet Take 1 tablet (15 mg total) by mouth at bedtime. 90 tablet 1   omeprazole (PRILOSEC) 40 MG capsule Take 1 capsule (40 mg total) by mouth 2 (two) times daily before a meal. 180 capsule 1   sucralfate (CARAFATE) 1 GM/10ML suspension Take 10 mLs (1 g total) by mouth every 6 (six) hours as needed (epigastric pain). 420 mL 0   No current facility-administered medications for this visit.   No Known Allergies   Review of Systems: All systems reviewed and negative except where noted in HPI.    DG ESOPHAGUS W DOUBLE CM (HD)  Result Date: 11/23/2021 CLINICAL DATA:  Dysphagia and globus sensation EXAM: ESOPHAGUS/BARIUM SWALLOW/TABLET STUDY TECHNIQUE: Combined double and single contrast examination was performed using effervescent crystals, high-density barium, and thin liquid barium.  This exam was performed by Pasty Spillers, PA, and was supervised and interpreted by Van Clines, MD. FLUOROSCOPY: Radiation Exposure Index (as provided by the fluoroscopic device): 12.90 mGy Kerma COMPARISON:  None Available. FINDINGS: Swallowing: Appears normal. No vestibular penetration or aspiration seen. Pharynx: Unremarkable. Esophagus: Normal appearance. Esophageal motility: Within normal limits. Hiatal Hernia: None. Gastroesophageal reflux: None visualized. Ingested 75m barium tablet: Not given Other: None. IMPRESSION: Normal esophagram. Electronically Signed   By: WVan ClinesM.D.   On: 11/23/2021 12:59    Lab Results  Component Value Date   WBC 5.1 08/10/2021   HGB 11.4 (L) 08/10/2021   HCT 35.6 (L) 08/10/2021   MCV 76.4 (L) 08/10/2021   PLT 416.0 (H) 08/10/2021    Lab Results  Component Value Date   CREATININE 0.79 08/10/2021   BUN 14 08/10/2021   NA 139 08/10/2021   K 3.7 08/10/2021   CL 103 08/10/2021   CO2 30 08/10/2021    Lab Results  Component Value Date   IRON 60 09/29/2021   TIBC 417.2 09/29/2021   FERRITIN 29.2 09/29/2021     Physical Exam: BP 128/68   Pulse 92   Ht '5\' 2"'$  (1.575 m)   Wt 135 lb 4 oz (61.3 kg)   LMP 10/25/2021 (Exact Date)   BMI 24.74 kg/m  Constitutional: Pleasant,well-developed, female in no acute distress. Neurological: Alert and oriented to person place and time. Psychiatric: Normal mood and affect. Behavior is normal.   ASSESSMENT: 41y.o. female here for assessment of the following  1. Gastroesophageal reflux disease without esophagitis   2. Dysphagia, unspecified type   3. Poor appetite   4. Dyspepsia    Now on high-dose PPI and liquid Carafate and her symptoms are definitely improved compared to previous.  EGD dilated her proximal esophagus at site of subtle stenosis and her dysphagia has significantly improved.  She appears to have NERD (nonerosive reflux disease).  Currently doing better on her regimen, if  she has symptoms that persist over time or if she wanted to come off medication, we need to consider TIF or surgical fundoplication.  We would need more objective evidence of reflux before considering that.  If her symptoms persisted I would recommend a 24-hour pH impedance study and manometry.  Right now she is doing better and wants to  hold off on that which I think is reasonable.  I discussed long-term risk of chronic PPIs with her to include CKD, increased risk for fracture etc., she understands this and wants to continue for now.  Long-term want to use the lowest dose of medication needed to control her symptoms.  We will see how she does in the next few months with this regimen.  I gave her some samples of FD guard to use to treat component of dyspepsia.  Advised her to use her Remeron nightly to help with dyspepsia and improve her appetite, she is currently taking only as needed.  I will see her again in 6 months or sooner with any issues.  She agrees  PLAN: - continue omeprazole '40mg'$  BID - refilled, counseled on long term risks - continue carafate - refilled - samples of FD gard to use PRN for dyspepsia, can get more OTC - recommend she take her Remeron nightly - f/u 6 months - consider pH test if issues sooner, wants to avoid to surgery / TIF if possible  Jolly Mango, MD [facility]

## 2021-12-31 ENCOUNTER — Encounter (HOSPITAL_BASED_OUTPATIENT_CLINIC_OR_DEPARTMENT_OTHER): Payer: Self-pay | Admitting: Obstetrics & Gynecology

## 2022-01-19 ENCOUNTER — Other Ambulatory Visit (HOSPITAL_BASED_OUTPATIENT_CLINIC_OR_DEPARTMENT_OTHER): Payer: Self-pay | Admitting: Obstetrics & Gynecology

## 2022-01-19 ENCOUNTER — Encounter (HOSPITAL_BASED_OUTPATIENT_CLINIC_OR_DEPARTMENT_OTHER): Payer: Self-pay | Admitting: Obstetrics & Gynecology

## 2022-01-19 MED ORDER — NITROFURANTOIN MONOHYD MACRO 100 MG PO CAPS: 100.0000 mg | ORAL_CAPSULE | Freq: Two times a day (BID) | ORAL | 0 refills | Status: DC

## 2022-01-25 ENCOUNTER — Ambulatory Visit (HOSPITAL_BASED_OUTPATIENT_CLINIC_OR_DEPARTMENT_OTHER): Payer: BC Managed Care – PPO

## 2022-01-31 ENCOUNTER — Other Ambulatory Visit (HOSPITAL_COMMUNITY)
Admission: RE | Admit: 2022-01-31 | Discharge: 2022-01-31 | Disposition: A | Discharge status: Home / Self Care | Payer: BC Managed Care – PPO | Source: Ambulatory Visit | Attending: Obstetrics & Gynecology | Admitting: Obstetrics & Gynecology

## 2022-01-31 ENCOUNTER — Ambulatory Visit (INDEPENDENT_AMBULATORY_CARE_PROVIDER_SITE_OTHER): Payer: BC Managed Care – PPO

## 2022-01-31 ENCOUNTER — Other Ambulatory Visit (HOSPITAL_BASED_OUTPATIENT_CLINIC_OR_DEPARTMENT_OTHER): Payer: Self-pay | Admitting: Obstetrics & Gynecology

## 2022-01-31 DIAGNOSIS — R350 Frequency of micturition: Secondary | ICD-10-CM

## 2022-01-31 DIAGNOSIS — N898 Other specified noninflammatory disorders of vagina: Secondary | ICD-10-CM | POA: Insufficient documentation

## 2022-01-31 DIAGNOSIS — R300 Dysuria: Secondary | ICD-10-CM

## 2022-01-31 LAB — POCT URINALYSIS DIPSTICK
Bilirubin, UA: NEGATIVE
Glucose, UA: NEGATIVE
Ketones, UA: NEGATIVE
Nitrite, UA: POSITIVE
Protein, UA: POSITIVE — AB
Spec Grav, UA: 1.02 (ref 1.010–1.025)
Urobilinogen, UA: 0.2 E.U./dL
pH, UA: 7 (ref 5.0–8.0)

## 2022-01-31 MED ORDER — SULFAMETHOXAZOLE-TRIMETHOPRIM 800-160 MG PO TABS: 1.0000 | ORAL_TABLET | Freq: Two times a day (BID) | ORAL | 0 refills | Status: DC

## 2022-01-31 NOTE — Progress Notes (Signed)
Patient came in today with complaints of urinary urgency.Patient is also complaining of vaginal discharge. Urine was checked and sent for urine culture. Patient also did an Aptima swab to be sent off for yeast, bacterial vaginosis and for STD's. tbw

## 2022-02-01 DIAGNOSIS — R350 Frequency of micturition: Secondary | ICD-10-CM | POA: Diagnosis not present

## 2022-02-02 LAB — CERVICOVAGINAL ANCILLARY ONLY
Bacterial Vaginitis (gardnerella): NEGATIVE
Candida Glabrata: NEGATIVE
Candida Vaginitis: NEGATIVE
Chlamydia: NEGATIVE
Comment: NEGATIVE
Comment: NEGATIVE
Comment: NEGATIVE
Comment: NEGATIVE
Comment: NEGATIVE
Comment: NORMAL
Neisseria Gonorrhea: NEGATIVE
Trichomonas: NEGATIVE

## 2022-02-04 ENCOUNTER — Other Ambulatory Visit (HOSPITAL_BASED_OUTPATIENT_CLINIC_OR_DEPARTMENT_OTHER): Payer: Self-pay | Admitting: *Deleted

## 2022-02-04 LAB — URINE CULTURE

## 2022-02-07 ENCOUNTER — Other Ambulatory Visit (HOSPITAL_BASED_OUTPATIENT_CLINIC_OR_DEPARTMENT_OTHER): Payer: Self-pay | Admitting: *Deleted

## 2022-02-07 DIAGNOSIS — R300 Dysuria: Secondary | ICD-10-CM

## 2022-02-07 MED ORDER — SULFAMETHOXAZOLE-TRIMETHOPRIM 800-160 MG PO TABS: 1.0000 | ORAL_TABLET | Freq: Two times a day (BID) | ORAL | 0 refills | Status: DC

## 2022-03-01 ENCOUNTER — Encounter: Payer: Self-pay | Admitting: Physician Assistant

## 2022-03-03 MED ORDER — ENSURE PLUS PO LIQD: 1.0000 | Freq: Every day | ORAL | 5 refills | Status: DC

## 2022-04-16 ENCOUNTER — Encounter: Payer: Self-pay | Admitting: Physician Assistant

## 2022-04-18 ENCOUNTER — Encounter (HOSPITAL_BASED_OUTPATIENT_CLINIC_OR_DEPARTMENT_OTHER): Payer: Self-pay | Admitting: Obstetrics & Gynecology

## 2022-05-07 ENCOUNTER — Other Ambulatory Visit (HOSPITAL_BASED_OUTPATIENT_CLINIC_OR_DEPARTMENT_OTHER): Payer: Self-pay | Admitting: Obstetrics & Gynecology

## 2022-05-10 ENCOUNTER — Encounter (HOSPITAL_BASED_OUTPATIENT_CLINIC_OR_DEPARTMENT_OTHER): Payer: Self-pay

## 2022-05-10 ENCOUNTER — Ambulatory Visit (HOSPITAL_BASED_OUTPATIENT_CLINIC_OR_DEPARTMENT_OTHER): Payer: BC Managed Care – PPO | Admitting: Obstetrics & Gynecology

## 2022-06-03 ENCOUNTER — Encounter (HOSPITAL_BASED_OUTPATIENT_CLINIC_OR_DEPARTMENT_OTHER): Payer: Self-pay | Admitting: Obstetrics & Gynecology

## 2022-06-03 ENCOUNTER — Ambulatory Visit (INDEPENDENT_AMBULATORY_CARE_PROVIDER_SITE_OTHER): Payer: BC Managed Care – PPO | Admitting: Obstetrics & Gynecology

## 2022-06-03 ENCOUNTER — Other Ambulatory Visit (HOSPITAL_COMMUNITY)
Admission: RE | Admit: 2022-06-03 | Discharge: 2022-06-03 | Disposition: A | Discharge status: Home / Self Care | Payer: BC Managed Care – PPO | Source: Ambulatory Visit | Attending: Obstetrics & Gynecology | Admitting: Obstetrics & Gynecology

## 2022-06-03 VITALS — BP 138/90 | HR 74 | Ht 62.0 in | Wt 128.0 lb

## 2022-06-03 DIAGNOSIS — Z1231 Encounter for screening mammogram for malignant neoplasm of breast: Secondary | ICD-10-CM

## 2022-06-03 DIAGNOSIS — Z862 Personal history of diseases of the blood and blood-forming organs and certain disorders involving the immune mechanism: Secondary | ICD-10-CM

## 2022-06-03 DIAGNOSIS — R030 Elevated blood-pressure reading, without diagnosis of hypertension: Secondary | ICD-10-CM

## 2022-06-03 DIAGNOSIS — Z113 Encounter for screening for infections with a predominantly sexual mode of transmission: Secondary | ICD-10-CM | POA: Insufficient documentation

## 2022-06-03 DIAGNOSIS — N926 Irregular menstruation, unspecified: Secondary | ICD-10-CM | POA: Diagnosis not present

## 2022-06-03 NOTE — Progress Notes (Signed)
GYNECOLOGY  VISIT  CC:   concerns about bleeding  HPI: 42 y.o. G0P0000 Significant Other Black or [place] female here for concerns about change in cycle this last month.  Had RFA treatment 01/2021.  Cycles have been regular since then.  Flow lasts 4 - 5 days.  Flow isn't heavy.  Cramping is much improved.  She has been feeling hormonal changes.  Then this week, her flow started early.  LMP was 3/18 and then started yesterday.  Flow is different.    Last few cycles were: 12/28, 1/20, 2/18, 3/18, and 4/4.  She also reports some cramping and just overall feels crummy.  Not sure if anything else is going on.  Denies temperature.  No urinary changes.   With new partner.  Desires STI testing.    Continuing to have esophageal issues.  Has lost weight due to eating less to help symptoms.  Seeing Dr. Adela LankArmbruster.  Was given another option for treatment and is considering this.     Past Medical History:  Diagnosis Date   Allergy    Anxiety    COVID-19 04/27/2021   Depression    Fibroids, subserous    GERD (gastroesophageal reflux disease)    History of ovarian cyst    Hypertension    IDA (iron deficiency anemia)    Microcytic anemia    Mild persistent asthma    Wears glasses     MEDS:   Current Outpatient Medications on File Prior to Visit  Medication Sig Dispense Refill   albuterol (PROVENTIL) (2.5 MG/3ML) 0.083% nebulizer solution Take 3 mLs (2.5 mg total) by nebulization every 6 (six) hours as needed for wheezing or shortness of breath. 150 mL 1   albuterol (VENTOLIN HFA) 108 (90 Base) MCG/ACT inhaler Inhale 2 puffs into the lungs every 6 (six) hours as needed. 1 each 2   buPROPion (WELLBUTRIN XL) 150 MG 24 hr tablet Take 150 mg by mouth daily.     Calcium Carb-Cholecalciferol (CALCIUM + D3 PO) Take by mouth every other day. PER PT TAKES TWO TABLESPOONS QOD     Ensure Plus (ENSURE PLUS) LIQD Take 1 Can by mouth daily. 7110 mL 5   ferrous sulfate (FER-IN-SOL) 75 (15 Fe) MG/ML  SOLN Take by mouth every other day. PER PT ONE OUNCE QOD     fluticasone (FLOVENT HFA) 110 MCG/ACT inhaler Inhale 2 puffs into the lungs 2 (two) times daily as needed. Prescribed twice daily but pt is only using as needed 1 each 2   ibuprofen (ADVIL) 800 MG tablet Take 1 tablet (800 mg total) by mouth every 8 (eight) hours as needed. 30 tablet 0   methocarbamol (ROBAXIN) 500 MG tablet TAKE 1 TO 2 TABLETS(500 TO 1000 MG) BY MOUTH EVERY 8 HOURS AS NEEDED FOR MUSCLE SPASMS OR HEADACHE OR PAIN 60 tablet 2   mirtazapine (REMERON) 15 MG tablet Take 1 tablet (15 mg total) by mouth at bedtime. 90 tablet 1   nitrofurantoin, macrocrystal-monohydrate, (MACROBID) 100 MG capsule Take 1 capsule (100 mg total) by mouth 2 (two) times daily. 10 capsule 0   omeprazole (PRILOSEC) 40 MG capsule Take 1 capsule (40 mg total) by mouth 2 (two) times daily before a meal. 180 capsule 2   sucralfate (CARAFATE) 1 GM/10ML suspension Take 10 mLs (1 g total) by mouth every 6 (six) hours as needed (epigastric pain). 420 mL 3   sulfamethoxazole-trimethoprim (BACTRIM DS) 800-160 MG tablet Take 1 tablet by mouth 2 (two) times daily. 10 tablet  0   No current facility-administered medications on file prior to visit.    ALLERGIES: Patient has no known allergies.  SH:  has significant other,   Review of Systems  Constitutional: Negative.   Genitourinary:        Irregular bleeding    PHYSICAL EXAMINATION:    BP (!) 138/90   Pulse 74   Ht 5\' 2"  (1.575 m) Comment: Reported  Wt 128 lb (58.1 kg)   LMP 06/03/2022 Comment: Not heavy  BMI 23.41 kg/m     General appearance: alert, cooperative and appears stated age CV:  Regular rate and rhythm Abdomen: soft, non-tender; bowel sounds normal; no masses,  no organomegaly Lymph:  no inguinal LAD noted  Pelvic: External genitalia:  no lesions              Urethra:  normal appearing urethra with no masses, tenderness or lesions              Bartholins and Skenes: normal                  Vagina: normal appearing vagina with normal color and discharge, no lesions              Cervix: no lesions              Bimanual Exam:  Uterus:  normal size, contour, position, consistency, mobility, non-tender              Adnexa: no mass, fullness, tenderness          Chaperone, Ina Homes, CMA, was present for exam.  Assessment/Plan: 1. Irregular bleeding - CBC with Differential/Platelet - Estradiol - Follicle stimulating hormone  2. Encounter for screening mammogram for malignant neoplasm of breast - MM 3D SCREENING MAMMOGRAM BILATERAL BREAST; Future  3. Screening examination for STD (sexually transmitted disease) - Cervicovaginal ancillary only( [facility])--checking GC/Chl/trich today  4. History of anemia - CBC with Differential/Platelet  5.  Elevated blood pressure reading - no hx of hypertension.  Pt is going to start watching this at home.  Doesn't feel well today so this may be contributing.  Will reach out to PCP, samantha worley, if stays elevated.

## 2022-06-04 LAB — CBC WITH DIFFERENTIAL/PLATELET
Basophils Absolute: 0 10*3/uL (ref 0.0–0.2)
Basos: 1 %
EOS (ABSOLUTE): 0 10*3/uL (ref 0.0–0.4)
Eos: 1 %
Hematocrit: 34.2 % (ref 34.0–46.6)
Hemoglobin: 11 g/dL — ABNORMAL LOW (ref 11.1–15.9)
Immature Grans (Abs): 0 10*3/uL (ref 0.0–0.1)
Immature Granulocytes: 0 %
Lymphocytes Absolute: 1.3 10*3/uL (ref 0.7–3.1)
Lymphs: 31 %
MCH: 23.8 pg — ABNORMAL LOW (ref 26.6–33.0)
MCHC: 32.2 g/dL (ref 31.5–35.7)
MCV: 74 fL — ABNORMAL LOW (ref 79–97)
Monocytes Absolute: 0.3 10*3/uL (ref 0.1–0.9)
Monocytes: 7 %
Neutrophils Absolute: 2.5 10*3/uL (ref 1.4–7.0)
Neutrophils: 60 %
Platelets: 379 10*3/uL (ref 150–450)
RBC: 4.63 x10E6/uL (ref 3.77–5.28)
RDW: 14.1 % (ref 11.7–15.4)
WBC: 4.1 10*3/uL (ref 3.4–10.8)

## 2022-06-04 LAB — ESTRADIOL: Estradiol: 14.7 pg/mL

## 2022-06-04 LAB — FOLLICLE STIMULATING HORMONE: FSH: 13.4 m[IU]/mL

## 2022-06-05 ENCOUNTER — Encounter (HOSPITAL_BASED_OUTPATIENT_CLINIC_OR_DEPARTMENT_OTHER): Payer: Self-pay | Admitting: Obstetrics & Gynecology

## 2022-06-06 LAB — CERVICOVAGINAL ANCILLARY ONLY
Chlamydia: NEGATIVE
Comment: NEGATIVE
Comment: NEGATIVE
Comment: NORMAL
Neisseria Gonorrhea: NEGATIVE
Trichomonas: NEGATIVE

## 2022-06-07 ENCOUNTER — Ambulatory Visit (INDEPENDENT_AMBULATORY_CARE_PROVIDER_SITE_OTHER): Payer: BC Managed Care – PPO | Admitting: Physician Assistant

## 2022-06-07 VITALS — BP 135/92 | HR 63 | Temp 97.5°F | Ht 62.0 in | Wt 129.2 lb

## 2022-06-07 DIAGNOSIS — I10 Essential (primary) hypertension: Secondary | ICD-10-CM | POA: Diagnosis not present

## 2022-06-07 MED ORDER — AMLODIPINE BESYLATE 2.5 MG PO TABS: 2.5000 mg | ORAL_TABLET | Freq: Every day | ORAL | 1 refills | Status: DC

## 2022-06-07 NOTE — Progress Notes (Signed)
Erika Frazier is a 42 y.o. female here for a follow up of a pre-existing problem.  History of Present Illness:   Chief Complaint  Patient presents with   c/o elevated blood pressure    Pt was GYN on 4/5 Bp was elevated and has been checking blood pressure 157/112, 149/104, 146/102. Has been having headaches.    HPI  HTN Currently taking no medication. At home blood pressure readings are: 157/112, 149/104, 146/102. Patient denies chest pain, SOB, blurred vision, dizziness, unusual headaches, lower leg swelling. Denies excessive caffeine intake, stimulant usage, excessive alcohol intake, or increase in salt consumption.  BP Readings from Last 3 Encounters:  06/07/22 (!) 135/92  06/03/22 (!) 138/90  12/14/21 128/68   Mom had HTN  Alcohol intake has purposefully decreased  Started a second job November and is currently working 6 days week    Past Medical History:  Diagnosis Date   Allergy    Anxiety    COVID-19 04/27/2021   Depression    Fibroids, subserous    GERD (gastroesophageal reflux disease)    History of ovarian cyst    Hypertension    IDA (iron deficiency anemia)    Microcytic anemia    Mild persistent asthma    Wears glasses      Social History   Tobacco Use   Smoking status: Former    Types: Cigars   Smokeless tobacco: Never   Tobacco comments:    Doesn't smoke tobacco  Vaping Use   Vaping Use: Never used  Substance Use Topics   Alcohol use: Yes    Alcohol/week: 4.0 standard drinks of alcohol    Types: 2 Glasses of wine, 2 Shots of liquor per week    Comment: occasional   Drug use: Yes    Types: Marijuana    Comment: 10-08-2020  per pt smokes average 3 times per week    Past Surgical History:  Procedure Laterality Date   COLONOSCOPY  08/20/2020   @ [facility]   ESOPHAGOGASTRODUODENOSCOPY  01/07/2020   @ [facility]   HYSTEROSCOPY N/A 10/13/2020   Procedure: HYSTEROSCOPY, DILATION AND CURETTAGE;  Surgeon: Jerene Bears, MD;  Location: [facility];  Service: Gynecology;  Laterality: N/A;    Family History  Problem Relation Age of Onset   Hypertension Mother    Alcohol abuse Father        now in remission   Lupus Maternal Aunt    Heart disease Maternal Aunt    Depression Maternal Aunt    Colon cancer Maternal Great-grandmother    Breast cancer Neg Hx     No Known Allergies  Current Medications:   Current Outpatient Medications:    albuterol (PROVENTIL) (2.5 MG/3ML) 0.083% nebulizer solution, Take 3 mLs (2.5 mg total) by nebulization every 6 (six) hours as needed for wheezing or shortness of breath., Disp: 150 mL, Rfl: 1   albuterol (VENTOLIN HFA) 108 (90 Base) MCG/ACT inhaler, Inhale 2 puffs into the lungs every 6 (six) hours as needed., Disp: 1 each, Rfl: 2   amLODipine (NORVASC) 2.5 MG tablet, Take 1 tablet (2.5 mg total) by mouth daily., Disp: 30 tablet, Rfl: 1   buPROPion (WELLBUTRIN XL) 150 MG 24 hr tablet, Take 150 mg by mouth daily., Disp: , Rfl:    Calcium Carb-Cholecalciferol (CALCIUM + D3 PO), Take by mouth every other day. PER PT TAKES TWO TABLESPOONS QOD, Disp: , Rfl:    Ensure Plus (ENSURE PLUS) LIQD, Take 1  Can by mouth daily., Disp: 7110 mL, Rfl: 5   ferrous sulfate (FER-IN-SOL) 75 (15 Fe) MG/ML SOLN, Take by mouth every other day. PER PT ONE OUNCE QOD, Disp: , Rfl:    fluticasone (FLOVENT HFA) 110 MCG/ACT inhaler, Inhale 2 puffs into the lungs 2 (two) times daily as needed. Prescribed twice daily but pt is only using as needed, Disp: 1 each, Rfl: 2   ibuprofen (ADVIL) 800 MG tablet, Take 1 tablet (800 mg total) by mouth every 8 (eight) hours as needed., Disp: 30 tablet, Rfl: 0   methocarbamol (ROBAXIN) 500 MG tablet, TAKE 1 TO 2 TABLETS(500 TO 1000 MG) BY MOUTH EVERY 8 HOURS AS NEEDED FOR MUSCLE SPASMS OR HEADACHE OR PAIN, Disp: 60 tablet, Rfl: 2   mirtazapine (REMERON) 15 MG tablet, Take 1 tablet (15 mg total) by mouth at bedtime., Disp: 90 tablet, Rfl: 1   omeprazole  (PRILOSEC) 40 MG capsule, Take 1 capsule (40 mg total) by mouth 2 (two) times daily before a meal., Disp: 180 capsule, Rfl: 2   Review of Systems:   ROS Negative unless otherwise specified per HPI.  Vitals:   Vitals:   06/07/22 1410 06/07/22 1431  BP: (!) 140/96 (!) 135/92  Pulse: 63   Temp: (!) 97.5 F (36.4 C)   TempSrc: Temporal   SpO2: 99%   Weight: 129 lb 4 oz (58.6 kg)   Height: 5\' 2"  (1.575 m)      Body mass index is 23.64 kg/m.  Physical Exam:   Physical Exam Vitals and nursing note reviewed.  Constitutional:      General: She is not in acute distress.    Appearance: She is well-developed. She is not ill-appearing or toxic-appearing.  Cardiovascular:     Rate and Rhythm: Normal rate and regular rhythm.     Pulses: Normal pulses.     Heart sounds: Normal heart sounds, S1 normal and S2 normal.  Pulmonary:     Effort: Pulmonary effort is normal.     Breath sounds: Normal breath sounds.  Skin:    General: Skin is warm and dry.  Neurological:     Mental Status: She is alert.     GCS: GCS eye subscore is 4. GCS verbal subscore is 5. GCS motor subscore is 6.  Psychiatric:        Speech: Speech normal.        Behavior: Behavior normal. Behavior is cooperative.     Assessment and Plan:   Primary hypertension Above goal No red flags No evidence of end organ damage Start amlodipine 2.5 mg daily Check BP 1-2 days per week and follow-up in 1 month, sooner if concerns    Jarold Motto, PA-C

## 2022-06-07 NOTE — Patient Instructions (Signed)
It was great to see you!  Start amlodipine 2.5 mg daily  Let's follow-up in 1 month, sooner if you have concerns.  Take care,  Jarold Motto PA-C

## 2022-06-08 ENCOUNTER — Encounter: Payer: Self-pay | Admitting: Physician Assistant

## 2022-06-08 LAB — COMPREHENSIVE METABOLIC PANEL
ALT: 8 U/L (ref 0–35)
AST: 11 U/L (ref 0–37)
Albumin: 4.1 g/dL (ref 3.5–5.2)
Alkaline Phosphatase: 58 U/L (ref 39–117)
BUN: 11 mg/dL (ref 6–23)
CO2: 29 mEq/L (ref 19–32)
Calcium: 9.6 mg/dL (ref 8.4–10.5)
Chloride: 104 mEq/L (ref 96–112)
Creatinine, Ser: 0.77 mg/dL (ref 0.40–1.20)
GFR: 96.04 mL/min (ref >60.00)
Glucose, Bld: 98 mg/dL (ref 70–99)
Potassium: 4 mEq/L (ref 3.5–5.1)
Sodium: 140 mEq/L (ref 135–145)
Total Bilirubin: 0.4 mg/dL (ref 0.2–1.2)
Total Protein: 6.4 g/dL (ref 6.0–8.3)

## 2022-06-08 LAB — TSH: TSH: 0.54 u[IU]/mL (ref 0.35–5.50)

## 2022-06-10 ENCOUNTER — Other Ambulatory Visit: Payer: Self-pay | Admitting: *Deleted

## 2022-06-10 MED ORDER — AMLODIPINE BESYLATE 2.5 MG PO TABS: 2.5000 mg | ORAL_TABLET | Freq: Every day | ORAL | 0 refills | Status: DC

## 2022-06-11 ENCOUNTER — Encounter: Payer: Self-pay | Admitting: Gastroenterology

## 2022-06-15 ENCOUNTER — Other Ambulatory Visit (HOSPITAL_BASED_OUTPATIENT_CLINIC_OR_DEPARTMENT_OTHER): Payer: Self-pay | Admitting: Obstetrics & Gynecology

## 2022-06-15 DIAGNOSIS — N926 Irregular menstruation, unspecified: Secondary | ICD-10-CM

## 2022-06-15 MED ORDER — NORETHINDRONE 0.35 MG PO TABS
1.0000 | ORAL_TABLET | Freq: Every day | ORAL | 5 refills | Status: DC
Start: 2022-06-15 — End: 2022-09-28

## 2022-06-16 NOTE — Telephone Encounter (Signed)
Spoke with pt and let her know that prescription for micronor had been sent to her pharmacy. Advised to start taking when she starts her cycle. Follow up appt made

## 2022-06-20 ENCOUNTER — Ambulatory Visit: Payer: BC Managed Care – PPO | Admitting: Physician Assistant

## 2022-07-13 DIAGNOSIS — M2012 Hallux valgus (acquired), left foot: Secondary | ICD-10-CM | POA: Diagnosis not present

## 2022-07-22 ENCOUNTER — Inpatient Hospital Stay (HOSPITAL_BASED_OUTPATIENT_CLINIC_OR_DEPARTMENT_OTHER): Admission: RE | Admit: 2022-07-22 | Payer: BC Managed Care – PPO | Source: Ambulatory Visit | Admitting: Radiology

## 2022-08-01 ENCOUNTER — Encounter (HOSPITAL_BASED_OUTPATIENT_CLINIC_OR_DEPARTMENT_OTHER): Payer: Self-pay | Admitting: Obstetrics & Gynecology

## 2022-08-03 ENCOUNTER — Ambulatory Visit (INDEPENDENT_AMBULATORY_CARE_PROVIDER_SITE_OTHER): Payer: BC Managed Care – PPO

## 2022-08-03 ENCOUNTER — Encounter (HOSPITAL_BASED_OUTPATIENT_CLINIC_OR_DEPARTMENT_OTHER): Payer: Self-pay

## 2022-08-03 ENCOUNTER — Other Ambulatory Visit (HOSPITAL_COMMUNITY)
Admission: RE | Admit: 2022-08-03 | Discharge: 2022-08-03 | Disposition: A | Discharge status: Home / Self Care | Payer: BC Managed Care – PPO | Source: Ambulatory Visit | Attending: Obstetrics & Gynecology | Admitting: Obstetrics & Gynecology

## 2022-08-03 VITALS — BP 133/96 | HR 77 | Ht 62.0 in | Wt 129.4 lb

## 2022-08-03 DIAGNOSIS — N898 Other specified noninflammatory disorders of vagina: Secondary | ICD-10-CM | POA: Diagnosis not present

## 2022-08-03 DIAGNOSIS — M792 Neuralgia and neuritis, unspecified: Secondary | ICD-10-CM | POA: Diagnosis not present

## 2022-08-03 NOTE — Progress Notes (Signed)
Patient came in today with complaints of vaginal discharge. Patient states she has no idea what's going on but wants ALL testing. tbw

## 2022-08-04 LAB — CERVICOVAGINAL ANCILLARY ONLY
Bacterial Vaginitis (gardnerella): POSITIVE — AB
Candida Glabrata: NEGATIVE
Candida Vaginitis: NEGATIVE
Chlamydia: NEGATIVE
Comment: NEGATIVE
Comment: NEGATIVE
Comment: NEGATIVE
Comment: NEGATIVE
Comment: NEGATIVE
Comment: NORMAL
Neisseria Gonorrhea: NEGATIVE
Trichomonas: NEGATIVE

## 2022-08-05 ENCOUNTER — Other Ambulatory Visit (HOSPITAL_BASED_OUTPATIENT_CLINIC_OR_DEPARTMENT_OTHER): Payer: Self-pay | Admitting: Obstetrics & Gynecology

## 2022-08-05 MED ORDER — METRONIDAZOLE 500 MG PO TABS: 500.0000 mg | ORAL_TABLET | Freq: Two times a day (BID) | ORAL | 0 refills | Status: DC

## 2022-08-11 ENCOUNTER — Other Ambulatory Visit (HOSPITAL_BASED_OUTPATIENT_CLINIC_OR_DEPARTMENT_OTHER): Payer: Self-pay | Admitting: *Deleted

## 2022-08-11 MED ORDER — FLUCONAZOLE 150 MG PO TABS: 150.0000 mg | ORAL_TABLET | Freq: Once | ORAL | 0 refills | Status: AC

## 2022-08-12 ENCOUNTER — Ambulatory Visit (HOSPITAL_BASED_OUTPATIENT_CLINIC_OR_DEPARTMENT_OTHER)
Admission: RE | Admit: 2022-08-12 | Discharge: 2022-08-12 | Disposition: A | Discharge status: Home / Self Care | Payer: BC Managed Care – PPO | Source: Ambulatory Visit | Attending: Obstetrics & Gynecology | Admitting: Obstetrics & Gynecology

## 2022-08-12 DIAGNOSIS — Z1231 Encounter for screening mammogram for malignant neoplasm of breast: Secondary | ICD-10-CM | POA: Diagnosis not present

## 2022-09-03 ENCOUNTER — Other Ambulatory Visit: Payer: Self-pay | Admitting: Physician Assistant

## 2022-09-07 ENCOUNTER — Other Ambulatory Visit: Payer: Self-pay

## 2022-09-07 MED ORDER — ALBUTEROL SULFATE HFA 108 (90 BASE) MCG/ACT IN AERS: 2.0000 | INHALATION_SPRAY | Freq: Four times a day (QID) | RESPIRATORY_TRACT | 2 refills | Status: DC | PRN

## 2022-09-16 ENCOUNTER — Other Ambulatory Visit: Payer: Self-pay | Admitting: Physician Assistant

## 2022-09-16 DIAGNOSIS — K2100 Gastro-esophageal reflux disease with esophagitis, without bleeding: Secondary | ICD-10-CM

## 2022-09-20 ENCOUNTER — Ambulatory Visit (INDEPENDENT_AMBULATORY_CARE_PROVIDER_SITE_OTHER): Payer: BC Managed Care – PPO | Admitting: Gastroenterology

## 2022-09-20 ENCOUNTER — Encounter: Payer: Self-pay | Admitting: Gastroenterology

## 2022-09-20 VITALS — BP 110/70 | HR 78 | Ht 62.0 in | Wt 132.0 lb

## 2022-09-20 DIAGNOSIS — K219 Gastro-esophageal reflux disease without esophagitis: Secondary | ICD-10-CM | POA: Diagnosis not present

## 2022-09-20 DIAGNOSIS — R1310 Dysphagia, unspecified: Secondary | ICD-10-CM | POA: Diagnosis not present

## 2022-09-20 DIAGNOSIS — Z79899 Other long term (current) drug therapy: Secondary | ICD-10-CM | POA: Diagnosis not present

## 2022-09-20 MED ORDER — SUCRALFATE 1 GM/10ML PO SUSP: 1.0000 g | Freq: Four times a day (QID) | ORAL | 1 refills | Status: DC | PRN

## 2022-09-20 MED ORDER — DEXLANSOPRAZOLE 60 MG PO CPDR: 60.0000 mg | DELAYED_RELEASE_CAPSULE | Freq: Every day | ORAL | 1 refills | Status: DC

## 2022-09-20 NOTE — Progress Notes (Signed)
HPI :  42 year old female here for a follow-up of GERD, dysphagia,   She has had longstanding symptoms of reflux she has had longstanding symptoms of reflux.  Her symptoms include pyrosis, regurgitation, cough, and globus.  Recall she had a few EGDs for this in the past, biopsy negative for EOE.  She had recurrent dysphagia and we performed an EGD last August.  There was no overt focal stenosis however empiric savory dilation performed and there was a small mucosal rent just below the UES had a suspected area of mild stenosis.There was no significant hiatal hernia but she did have Hill grade 3 gastroesophageal flap on retroflexed view.  I switched her pantoprazole to omeprazole 40 mg twice daily at the time.  Added back some liquid Carafate to use as needed for some symptoms she was having post dilation.  Post EGD barium swallow showed no dysmotility, no overt reflux.   The last time I saw her in October she had been doing better on the omeprazole.  We discussed whether or not she wanted to pursue possible fundoplication/TIF or continue with medical management at the last visit, she wanted to continue medical management.  She states she is taking omeprazole 40 mg twice daily as well as Tums as needed.  She states the regimen is not working as well as it used to.  She continues to have pyrosis that bothers her.  She states it sporadic and bothers her intermittently throughout the day.  Denies any significant nocturnal symptoms.  She states the dilation did resolve her dysphagia for some time however her symptoms have since come back and she is having dysphagia to solids in her throat again.  She states the Carafate did help her symptoms at the time but she stopped using it.  She has concerns about long-term use of PPI and wants to consider more of an interventional therapy to come off PPI.    Prior workup: Colonoscopy 08/20/20: Dr. Octaviano Glow Findings - adequate prep The Terminal Ileum was intubated  for 10 cm and was normal All examined segments of the colon appeared normal. Medium Internal hemorrhoids were seen on rectal retroflection.   EGD 01/07/20: Dr. Octaviano Glow Normal esophagus, biopsied. Empiric dilation to 17mm performed. Normal stomach. Duodenum normal. Biopsies taken.   Pathologic Diagnosis A. SMALL INTESTINE, DUODENUM, ENDOSCOPIC BIOPSY: FRAGMENTS OF UNREMARKABLE SMALL INTESTINAL MUCOSA. NO EVIDENCE OF CELIAC DISEASE.  B. ESOPHAGUS, DISTAL, ENDOSCOPIC BIOPSY: FRAGMENTS OF UNREMARKABLE ESOPHAGEAL MUCOSA. NO EVIDENCE OF EOSINOPHILIC ESOPHAGITIS.  C. ESOPHAGUS, PROXIMAL, ENDOSCOPIC BIOPSY: FRAGMENTS OF UNREMARKABLE ESOPHAGEAL MUCOSA. NO EVIDENCE OF EOSINOPHILIC ESOPHAGITIS.      EGD 10/18/21: - The gastroesophageal flap valve was visualized endoscopically and classified as Hill Grade III. - The exam of the esophagus was otherwise normal. No inflammatory changes, no erosive change, no stricture / stenosis. - A guidewire was placed and the scope was withdrawn. Empiric dilation was performed in the entire esophagus with a Savary dilator with mild resistance at 17 mm. Relook endoscopy showed an appropriate mucosal wrent at the upper esophagus just inferior to the UES. - The entire examined stomach was normal. - A single 3 mm sessile polyp was found in the duodenal bulb. The polyp was removed with a cold biopsy forceps. Resection and retrieval were complete. - The exam of the duodenum was otherwise normal.   Surgical [P], duodenal polyp - SUPERFICIAL FRAGMENTS OF DUODENAL MUCOSA WITH EXTENSIVE GASTRIC SURFACE FOVEOLAR METAPLASIA, SEE NOTE - NEGATIVE FOR DYSPLASIA OR MALIGNANCY IN SUBMITTED MATERIAL  Barium swallow 11/23/21: IMPRESSION: Normal esophagram    Past Medical History:  Diagnosis Date   Allergy    Anxiety    COVID-19 04/27/2021   Depression    Fibroids, subserous    GERD (gastroesophageal reflux disease)    History of ovarian cyst    Hypertension     IDA (iron deficiency anemia)    Microcytic anemia    Mild persistent asthma    Wears glasses      Past Surgical History:  Procedure Laterality Date   COLONOSCOPY  08/20/2020   @ [facility]   ESOPHAGOGASTRODUODENOSCOPY  01/07/2020   @ [facility]   HYSTEROSCOPY N/A 10/13/2020   Procedure: HYSTEROSCOPY, DILATION AND CURETTAGE;  Surgeon: Jerene Bears, MD;  Location: [facility];  Service: Gynecology;  Laterality: N/A;   Family History  Problem Relation Age of Onset   Hypertension Mother    Alcohol abuse Father        now in remission   Lupus Maternal Aunt    Heart disease Maternal Aunt    Depression Maternal Aunt    Colon cancer Maternal Great-grandmother    Breast cancer Neg Hx    Social History   Tobacco Use   Smoking status: Former    Types: Cigars   Smokeless tobacco: Never   Tobacco comments:    Doesn't smoke tobacco  Vaping Use   Vaping status: Never Used  Substance Use Topics   Alcohol use: Yes    Alcohol/week: 4.0 standard drinks of alcohol    Types: 2 Glasses of wine, 2 Shots of liquor per week    Comment: occasional   Drug use: Yes    Types: Marijuana    Comment: 10-08-2020  per pt smokes average 3 times per week   Current Outpatient Medications  Medication Sig Dispense Refill   amLODipine (NORVASC) 2.5 MG tablet TAKE 1 TABLET(2.5 MG) BY MOUTH DAILY 30 tablet 0   buPROPion (WELLBUTRIN XL) 150 MG 24 hr tablet Take 150 mg by mouth daily.     Calcium Carb-Cholecalciferol (CALCIUM + D3 PO) Take by mouth every other day. PER PT TAKES TWO TABLESPOONS QOD     ferrous sulfate (FER-IN-SOL) 75 (15 Fe) MG/ML SOLN Take by mouth every other day. PER PT ONE OUNCE QOD     metroNIDAZOLE (FLAGYL) 500 MG tablet Take 1 tablet (500 mg total) by mouth 2 (two) times daily. 14 tablet 0   mirtazapine (REMERON) 15 MG tablet TAKE 1 TABLET(15 MG) BY MOUTH AT BEDTIME 90 tablet 0   omeprazole (PRILOSEC) 40 MG capsule Take 1 capsule (40 mg total) by  mouth 2 (two) times daily before a meal. 180 capsule 2   albuterol (PROVENTIL) (2.5 MG/3ML) 0.083% nebulizer solution Take 3 mLs (2.5 mg total) by nebulization every 6 (six) hours as needed for wheezing or shortness of breath. (Patient not taking: Reported on 09/20/2022) 150 mL 1   albuterol (VENTOLIN HFA) 108 (90 Base) MCG/ACT inhaler Inhale 2 puffs into the lungs every 6 (six) hours as needed. (Patient not taking: Reported on 09/20/2022) 1 each 2   Ensure Plus (ENSURE PLUS) LIQD Take 1 Can by mouth daily. (Patient not taking: Reported on 09/20/2022) 7110 mL 5   fluticasone (FLOVENT HFA) 110 MCG/ACT inhaler Inhale 2 puffs into the lungs 2 (two) times daily as needed. Prescribed twice daily but pt is only using as needed (Patient not taking: Reported on 09/20/2022) 1 each 2   ibuprofen (ADVIL) 800 MG tablet  Take 1 tablet (800 mg total) by mouth every 8 (eight) hours as needed. (Patient not taking: Reported on 09/20/2022) 30 tablet 0   methocarbamol (ROBAXIN) 500 MG tablet TAKE 1 TO 2 TABLETS(500 TO 1000 MG) BY MOUTH EVERY 8 HOURS AS NEEDED FOR MUSCLE SPASMS OR HEADACHE OR PAIN (Patient not taking: Reported on 09/20/2022) 60 tablet 2   norethindrone (MICRONOR) 0.35 MG tablet Take 1 tablet (0.35 mg total) by mouth daily. (Patient not taking: Reported on 09/20/2022) 28 tablet 5   No current facility-administered medications for this visit.   No Known Allergies   Review of Systems: All systems reviewed and negative except where noted in HPI.   Lab Results  Component Value Date   WBC 4.1 06/03/2022   HGB 11.0 (L) 06/03/2022   HCT 34.2 06/03/2022   MCV 74 (L) 06/03/2022   PLT 379 06/03/2022    Lab Results  Component Value Date   NA 140 06/07/2022   CL 104 06/07/2022   K 4.0 06/07/2022   CO2 29 06/07/2022   BUN 11 06/07/2022   CREATININE 0.77 06/07/2022   GFR 96.04 06/07/2022   CALCIUM 9.6 06/07/2022   ALBUMIN 4.1 06/07/2022   GLUCOSE 98 06/07/2022   Lab Results  Component Value Date   ALT  8 06/07/2022   AST 11 06/07/2022   ALKPHOS 58 06/07/2022   BILITOT 0.4 06/07/2022    Lab Results  Component Value Date   IRON 60 09/29/2021   TIBC 417.2 09/29/2021   FERRITIN 29.2 09/29/2021     Physical Exam: BP 110/70   Pulse 78   Ht 5\' 2"  (1.575 m)   Wt 132 lb (59.9 kg)   BMI 24.14 kg/m  Constitutional: Pleasant,well-developed, female in no acute distress. Neurological: Alert and oriented to person place and time. Psychiatric: Normal mood and affect. Behavior is normal.   ASSESSMENT: 42 y.o. female here for assessment of the following  1. Gastroesophageal reflux disease, unspecified whether esophagitis present   2. Dysphagia, unspecified type   3. Long-term current use of proton pump inhibitor therapy    Longstanding symptoms of reflux, mainly pyrosis and epigastric burning, improved with PPI but not resolved.  She has been on now pantoprazole and omeprazole, high-dose, with initial improvement but ultimately having frequent breakthrough at this time and remains poorly controlled.  She also has recurrent dysphagia which is likely due to subtle stricture just inferior to the UES, had benefit with dilation from this about a year ago.  We discussed long-term options.  We discussed long-term risk of chronic PPI use to include increased risk of bone fracture, chronic kidney disease, C. difficile etc.  Long-term want to use lowest dose needed to control symptoms or use of an alternative.  She is concerned about using these, especially if they do not seem to be working as well as they used to.  In this light, she needs to consider interventional therapy such as surgery or TIF.  Given she had a Hill grade of 3 on her last EGD, not sure if she is a candidate for TIF.  Further, if she wants to be considered for surgery or TIF, we need objective evidence of reflux as EGD with biopsy and barium study has not confirmed this yet.  After discussion, she wants to proceed with an EGD with  Bravo placement off PPI to confirm reflux.  If this confirms pathologic reflux, we will either refer her to surgery for consideration of Nissen fundoplication versus consideration for TIF  if she is a candidate for it based on endoscopic evaluation.  Will hold PPI for 1 week prior to the Bravo study.  With the EGD I will also plan on dilating her esophagus to treat her dysphagia.  We discussed risk and benefits of the procedure and anesthesia and she wants to proceed.  In the interim, will apply to see if we can get Dexilant in place of omeprazole however may not be covered by her insurance.  If it is we will try that at 60 mg daily.  She can resume Carafate as needed as that has helped her in the past, in the interim.  She agrees.   PLAN: - trial of Dexilant 60mg  / day - 90 day supply - failed omeprazole / pantoprazole - liquid carafate 10cc every 6 hours PRN - 1 bottle and refill - discussed long term risks / benefits of chronic PPIs and she wants to consider coming off medication and considering interventional therapy - may be surgical Nissen given Hill grade score on last EGD but need to confirm acid reflux prior to proceeding - schedule EGD with dilation and Bravo study in the [facility] - OFF PPI for one week - if Bravo confirms GERD, then refer to surgery or consider TIF pending if endoscopically a candidate for it  Harlin Rain, MD [facility]

## 2022-09-20 NOTE — Patient Instructions (Signed)
You have been scheduled for an endoscopy with Bravo. Please follow written instructions given to you at your visit today.  If you use inhalers (even only as needed), please bring them with you on the day of your procedure.  If you take any of the following medications, they will need to be adjusted prior to your procedure:   DO NOT TAKE 7 DAYS PRIOR TO TEST- Trulicity (dulaglutide) Ozempic, Wegovy (semaglutide) Mounjaro (tirzepatide) Bydureon Bcise (exanatide extended release)  DO NOT TAKE 1 DAY PRIOR TO YOUR TEST Rybelsus (semaglutide) Adlyxin (lixisenatide) Victoza (liraglutide) Byetta (exanatide) ___________________________________________________________________________  We have sent the following medications to your pharmacy for you to pick up at your convenience: Dexilant 60 mg: Take once daily (hold 7 days prior to your procedure)  Carafate suspension: Take 10 ml every 6 hours as needed  Thank you for entrusting me with your care and for choosing [facility], Dr. Ileene Patrick  If your blood pressure at your visit was 140/90 or greater, please contact your primary care physician to follow up on this. ______________________________________________________  If you are age 52 or older, your body mass index should be between 23-30. Your Body mass index is 24.14 kg/m. If this is out of the aforementioned range listed, please consider follow up with your Primary Care Provider.  If you are age 33 or younger, your body mass index should be between 19-25. Your Body mass index is 24.14 kg/m. If this is out of the aformentioned range listed, please consider follow up with your Primary Care Provider.  ________________________________________________________  The [facility] providers would like to encourage you to use [facility] to communicate with providers for non-urgent requests or questions.  Due to long hold times on the telephone, sending your provider a message by  [facility] may be a faster and more efficient way to get a response.  Please allow 48 business hours for a response.  Please remember that this is for non-urgent requests.  _______________________________________________________  Due to recent changes in healthcare laws, you may see the results of your imaging and laboratory studies on MyChart before your provider has had a chance to review them.  We understand that in some cases there may be results that are confusing or concerning to you. Not all laboratory results come back in the same time frame and the provider may be waiting for multiple results in order to interpret others.  Please give [name] 48 hours in order for your provider to thoroughly review all the results before contacting the office for clarification of your results.

## 2022-09-28 ENCOUNTER — Ambulatory Visit (AMBULATORY_SURGERY_CENTER): Payer: BC Managed Care – PPO | Admitting: Gastroenterology

## 2022-09-28 ENCOUNTER — Encounter: Payer: Self-pay | Admitting: Gastroenterology

## 2022-09-28 VITALS — BP 134/91 | HR 80 | Temp 98.4°F | Resp 17 | Ht 62.0 in | Wt 132.0 lb

## 2022-09-28 DIAGNOSIS — R12 Heartburn: Secondary | ICD-10-CM | POA: Diagnosis not present

## 2022-09-28 DIAGNOSIS — K219 Gastro-esophageal reflux disease without esophagitis: Secondary | ICD-10-CM

## 2022-09-28 DIAGNOSIS — R1310 Dysphagia, unspecified: Secondary | ICD-10-CM

## 2022-09-28 MED ORDER — SODIUM CHLORIDE 0.9 % IV SOLN: 500.0000 mL | Freq: Once | INTRAVENOUS | Status: DC

## 2022-09-28 NOTE — Progress Notes (Signed)
Called to room to assist during endoscopic procedure.  Patient ID and intended procedure confirmed with present staff. Received instructions for my participation in the procedure from the performing physician.  

## 2022-09-28 NOTE — Progress Notes (Signed)
Capsule expiration date- 12/02/23  Capsule ID number [id number]   LES measurement: 39 (capsule placed 6 cm above LES) 33  Time of implant: 1340

## 2022-09-28 NOTE — Progress Notes (Signed)
History and Physical Interval Note: See note from 09/20/22 - patient here for EGD with dilation for dysphagia, esophageal stricture, and then Bravo placement for workup of GERD and to see if candidate for TIF / surgery, as she is failing PPI. She has been off PPI since the office visit. I have discussed risks / benefits of the exam and anesthesia and she wishes to proceed. No interval changes.  09/28/2022 1:25 PM  Erika Frazier  has presented today for endoscopic procedure(s), with the diagnosis of  Encounter Diagnoses  Name Primary?   Gastroesophageal reflux disease, unspecified whether esophagitis present Yes   Dysphagia, unspecified type   .  The various methods of evaluation and treatment have been discussed with the patient and/or family. After consideration of risks, benefits and other options for treatment, the patient has consented to  the endoscopic procedure(s).   The patient's history has been reviewed, patient examined, no change in status, stable for surgery.  I have reviewed the patient's chart and labs.  Questions were answered to the patient's satisfaction.    Harlin Rain, MD [facility]

## 2022-09-28 NOTE — Progress Notes (Signed)
Sedate, gd SR, tolerated procedure well, VSS, report to RN 

## 2022-09-28 NOTE — Op Note (Signed)
[facility] Patient Name: Erika Frazier Procedure Date: 09/28/2022 12:57 PM MRN: [id number] Endoscopist: Viviann Spare P. Adela Lank , MD, [id number] Age: 42 Referring MD:  Date of Birth: 1980-10-09 Gender: Female Account #: [id number] Procedure:                Upper GI endoscopy Indications:              Dysphagia, Suspected gastro-esophageal reflux                            disease - failure of PPI to control symptoms, now                            off PPI, Bravo placement done to clarify if she has                            reflux or not, and if so, will consider TIF or                            surgical evaluation Medicines:                Monitored Anesthesia Care Procedure:                Pre-Anesthesia Assessment:                           - Prior to the procedure, a History and Physical                            was performed, and patient medications and                            allergies were reviewed. The patient's tolerance of                            previous anesthesia was also reviewed. The risks                            and benefits of the procedure and the sedation                            options and risks were discussed with the patient.                            All questions were answered, and informed consent                            was obtained. Prior Anticoagulants: The patient has                            taken no anticoagulant or antiplatelet agents. ASA                            Grade Assessment: II - A patient with mild systemic  disease. After reviewing the risks and benefits,                            the patient was deemed in satisfactory condition to                            undergo the procedure.                           After obtaining informed consent, the endoscope was                            passed under direct vision. Throughout the                            procedure, the patient's blood  pressure, pulse, and                            oxygen saturations were monitored continuously. The                            GIF [id number] #[id number] was introduced through the                            mouth, and advanced to the second part of duodenum.                            The upper GI endoscopy was accomplished without                            difficulty. The patient tolerated the procedure                            well. Scope In: Scope Out: Findings:                 Esophagogastric landmarks were identified: the                            Z-line was found at 39 cm, the gastroesophageal                            junction was found at 39 cm and the upper extent of                            the gastric folds was found at 40 cm from the                            incisors.                           A 1 cm hiatal hernia was present.                           The gastroesophageal flap valve was visualized  endoscopically and classified as Hill Grade III                           The exam of the esophagus was otherwise normal.                           A guidewire was placed and the scope was withdrawn.                            Empiric dilation was performed in the entire                            esophagus with a Savary dilator with mild                            resistance at 17 mm. Relook endoscopy showed an                            appropriate mucosal wrent just inferior to the UES,                            at site of suspected subtle stricture. The BRAVO                            capsule with delivery system was introduced through                            the mouth and advanced into the esophagus, such                            that the BRAVO pH capsule was positioned 33 cm from                            the incisors, which was 6 cm proximal to the GE                            junction. The BRAVO pH capsule was then deployed                             and attached to the esophageal mucosa. The delivery                            system was then withdrawn. Endoscopy was utilized                            for probe placement and diagnostic evaluation.                           The entire examined stomach was normal.                           The examined duodenum was normal. Complications:  No immediate complications. Estimated blood loss:                            Minimal. Estimated Blood Loss:     Estimated blood loss was minimal. Impression:               - Esophagogastric landmarks identified.                           - 1 cm hiatal hernia.                           - Gastroesophageal flap valve classified as Hill                            Grade III (minimal fold, loose to endoscope, hiatal                            hernia likely).                           - Empiric dilation performed to 17mm which caused                            dilation effect at site of suspected subtle                            stricture just inferior to the UES                           - the BRAVO pH capsule was deployed                           - Normal stomach.                           - Normal examined duodenum. Recommendation:           - Patient has a contact number available for                            emergencies. The signs and symptoms of potential                            delayed complications were discussed with the                            patient. Return to normal activities tomorrow.                            Written discharge instructions were provided to the                            patient.                           - Post dilation diet                           -  Continue present medications.                           - Stay off PPI / antacids during Bravo study                           - Further recommending pending results of Bravo                            study Arjan Strohm P. Lisset Ketchem,  MD 09/28/2022 1:49:23 PM This report has been signed electronically.

## 2022-09-28 NOTE — Patient Instructions (Addendum)
Post dilation diet ( instructions/handout provided).  Stay off PPI / antacids during the Bravo study.  Continue medications.  Handout on esophagitis/stricture and post dilation diet provided.  Bravo instructions and log provided  YOU HAD AN ENDOSCOPIC PROCEDURE TODAY AT THE [facility]:   Refer to the procedure report that was given to you for any specific questions about what was found during the examination.  If the procedure report does not answer your questions, please call your gastroenterologist to clarify.  If you requested that your care partner not be given the details of your procedure findings, then the procedure report has been included in a sealed envelope for you to review at your convenience later.  YOU SHOULD EXPECT: Some feelings of bloating in the abdomen. Passage of more gas than usual.  Walking can help get rid of the air that was put into your GI tract during the procedure and reduce the bloating. If you had a lower endoscopy (such as a colonoscopy or flexible sigmoidoscopy) you may notice spotting of blood in your stool or on the toilet paper. If you underwent a bowel prep for your procedure, you may not have a normal bowel movement for a few days.  Please Note:  You might notice some irritation and congestion in your nose or some drainage.  This is from the oxygen used during your procedure.  There is no need for concern and it should clear up in a day or so.  SYMPTOMS TO REPORT IMMEDIATELY:   Following upper endoscopy (EGD)  Vomiting of blood or coffee ground material  New chest pain or pain under the shoulder blades  Painful or persistently difficult swallowing  New shortness of breath  Fever of 100F or higher  Black, tarry-looking stools  For urgent or emergent issues, a gastroenterologist can be reached at any hour by calling [phone]. Do not use MyChart messaging for urgent concerns.    DIET:  Post dilation diet (see handout).  Drink  plenty of fluids but you should avoid alcoholic beverages for 24 hours.  ACTIVITY:  You should plan to take it easy for the rest of today and you should NOT DRIVE or use heavy machinery until tomorrow (because of the sedation medicines used during the test).    FOLLOW UP: Our staff will call the number listed on your records the next business day following your procedure.  We will call around 7:15- 8:00 am to check on you and address any questions or concerns that you may have regarding the information given to you following your procedure. If we do not reach you, we will leave a message.     If any biopsies were taken you will be contacted by phone or by letter within the next 1-3 weeks.  Please call us at [phone] if you have not heard about the biopsies in 3 weeks.    SIGNATURES/CONFIDENTIALITY: You and/or your care partner have signed paperwork which will be entered into your electronic medical record.  These signatures attest to the fact that that the information above on your After Visit Summary has been reviewed and is understood.  Full responsibility of the confidentiality of this discharge information lies with you and/or your care-partner.

## 2022-09-30 ENCOUNTER — Telehealth: Payer: Self-pay | Admitting: *Deleted

## 2022-09-30 NOTE — Telephone Encounter (Signed)
  Follow up Call-     09/28/2022   12:56 PM 10/18/2021    2:10 PM  Call back number  Post procedure Call Back phone  # [phone]  Permission to leave phone message Yes Yes     Patient questions:  Do you have a fever, pain , or abdominal swelling? No. Pain Score  0 *  Have you tolerated food without any problems? Yes.    Have you been able to return to your normal activities? Yes.    Do you have any questions about your discharge instructions: Diet   No. Medications  No. Follow up visit  No.  Do you have questions or concerns about your Care? No.  Actions: * If pain score is 4 or above: No action needed, pain <4.

## 2022-10-17 ENCOUNTER — Ambulatory Visit (HOSPITAL_BASED_OUTPATIENT_CLINIC_OR_DEPARTMENT_OTHER): Payer: BC Managed Care – PPO | Admitting: Obstetrics & Gynecology

## 2022-10-18 ENCOUNTER — Encounter: Payer: Self-pay | Admitting: Gastroenterology

## 2022-10-18 ENCOUNTER — Telehealth: Payer: Self-pay | Admitting: Gastroenterology

## 2022-10-18 NOTE — Telephone Encounter (Signed)
error 

## 2022-10-18 NOTE — Telephone Encounter (Signed)
Pt sent a MyChart message as well requesting same information. Message has been sent to Dr. Adela Lank.

## 2022-10-18 NOTE — Telephone Encounter (Signed)
Bravo results returned: to be scanned into Epic Demeester score 24.4, she does have significant GERD OFF PPI.  I have reached out to Dr. Barron Alvine to see if she is a TIF candidate or not given her Hill grade, will see if she is considered for TIF versus surgical referral.  Once I can discuss her case with him I will get back to her with results and recommendations.

## 2022-10-18 NOTE — Telephone Encounter (Signed)
PT is requesting to have her July EGD results discussed with her. Please advise.

## 2022-10-19 NOTE — Telephone Encounter (Signed)
See 8/20 patient message for details.

## 2022-10-20 ENCOUNTER — Encounter: Payer: Self-pay | Admitting: Gastroenterology

## 2022-10-20 NOTE — Telephone Encounter (Signed)
[place] can you help relay the following: - patient's Bravo study does confirm she has a lot of reflux - given her symptoms persist despite PPI, we need to consider alternative treatments which I discussed in the office with her previously - I spoke with Dr. Barron Alvine about TIF vs. Surgery. He thinks she is a candidate for TIF combined with a surgery to treat this. He has offered to see her in the office to discuss this approach. If she is willing can you book her with him to discuss this option? She should have resumed her PPI by now, hopefully she is back to taking it, I assume she will feel worse off therapy until we can do an intervention.  Thanks

## 2022-10-21 NOTE — Telephone Encounter (Signed)
Called and spoke with patient regarding EGD bravo results and recommendations. Pt states that she was not able to begin Dexilant due to cost. Pt has resumed Protonix 40 mg BID and Carafate as prescribed. Pt has been scheduled for a consultation with Dr. Barron Alvine on Tuesday, 12/06/22 at 3:40 pm. Pt verbalized understanding and had no concerns at the end of the call.

## 2022-11-22 ENCOUNTER — Other Ambulatory Visit (HOSPITAL_BASED_OUTPATIENT_CLINIC_OR_DEPARTMENT_OTHER): Payer: Self-pay | Admitting: Obstetrics & Gynecology

## 2022-11-22 ENCOUNTER — Encounter (HOSPITAL_BASED_OUTPATIENT_CLINIC_OR_DEPARTMENT_OTHER): Payer: Self-pay | Admitting: Obstetrics & Gynecology

## 2022-11-22 DIAGNOSIS — N3000 Acute cystitis without hematuria: Secondary | ICD-10-CM

## 2022-11-23 ENCOUNTER — Other Ambulatory Visit (HOSPITAL_COMMUNITY)
Admission: RE | Admit: 2022-11-23 | Discharge: 2022-11-23 | Disposition: A | Discharge status: Home / Self Care | Payer: BC Managed Care – PPO | Source: Ambulatory Visit | Attending: Obstetrics & Gynecology | Admitting: Obstetrics & Gynecology

## 2022-11-23 ENCOUNTER — Encounter (HOSPITAL_BASED_OUTPATIENT_CLINIC_OR_DEPARTMENT_OTHER): Payer: Self-pay

## 2022-11-23 ENCOUNTER — Ambulatory Visit (INDEPENDENT_AMBULATORY_CARE_PROVIDER_SITE_OTHER): Payer: BC Managed Care – PPO

## 2022-11-23 VITALS — BP 118/75 | HR 89 | Ht 62.0 in | Wt 140.6 lb

## 2022-11-23 DIAGNOSIS — N3090 Cystitis, unspecified without hematuria: Secondary | ICD-10-CM

## 2022-11-23 DIAGNOSIS — R3915 Urgency of urination: Secondary | ICD-10-CM

## 2022-11-23 DIAGNOSIS — N898 Other specified noninflammatory disorders of vagina: Secondary | ICD-10-CM | POA: Diagnosis not present

## 2022-11-23 DIAGNOSIS — R3129 Other microscopic hematuria: Secondary | ICD-10-CM

## 2022-11-23 LAB — POCT URINALYSIS DIPSTICK
Bilirubin, UA: NEGATIVE
Glucose, UA: POSITIVE — AB
Ketones, UA: NEGATIVE
Nitrite, UA: POSITIVE
Protein, UA: POSITIVE — AB
Spec Grav, UA: 1.02 (ref 1.010–1.025)
Urobilinogen, UA: 1 E.U./dL
pH, UA: 8.5 — AB (ref 5.0–8.0)

## 2022-11-23 MED ORDER — PHENAZOPYRIDINE HCL 200 MG PO TABS: 200.0000 mg | ORAL_TABLET | Freq: Three times a day (TID) | ORAL | 0 refills | Status: DC | PRN

## 2022-11-23 MED ORDER — NITROFURANTOIN MONOHYD MACRO 100 MG PO CAPS: 100.0000 mg | ORAL_CAPSULE | Freq: Two times a day (BID) | ORAL | 0 refills | Status: DC

## 2022-11-23 NOTE — Progress Notes (Signed)
Patient came in today to give a urine sample and to do the self aptima swab. Patient is complaining of urinary urgency and vaginal discharge. Urine sample obtain, evaluated and sent for culture. Patient states she has taken some over the counter medication (AZO) for the symptoms. Aptima swab also obtained.   After the results of urine dipstick, patient was notified of results. Office protocol was sent in for patient. That included Pyridium 200mg  one tablet three times daily for two days and Macrobid 100mg  one tablet twice daily for 5 days. tbw

## 2022-11-24 LAB — CERVICOVAGINAL ANCILLARY ONLY
Bacterial Vaginitis (gardnerella): POSITIVE — AB
Candida Glabrata: NEGATIVE
Candida Vaginitis: NEGATIVE
Comment: NEGATIVE
Comment: NEGATIVE
Comment: NEGATIVE

## 2022-11-26 LAB — URINE CULTURE

## 2022-11-28 ENCOUNTER — Other Ambulatory Visit (HOSPITAL_BASED_OUTPATIENT_CLINIC_OR_DEPARTMENT_OTHER): Payer: Self-pay | Admitting: *Deleted

## 2022-11-28 ENCOUNTER — Telehealth: Payer: Self-pay | Admitting: Gastroenterology

## 2022-11-28 MED ORDER — METRONIDAZOLE 500 MG PO TABS: 500.0000 mg | ORAL_TABLET | Freq: Two times a day (BID) | ORAL | 0 refills | Status: DC

## 2022-11-28 NOTE — Telephone Encounter (Signed)
Patient has been scheduled to see Dr Barron Alvine in the office on 01/03/23 to discuss TIF. She is advised that currently, TIF procedure would not be able to be completed until at soonest January 2025. She verbalizes understanding.

## 2022-11-28 NOTE — Telephone Encounter (Signed)
Patient had an appointment with Dr. Barron Alvine 10/8 to discuss TIF.  Dr. Barron Alvine is scheduled to cover the hospital and her appointment had to be cancelled.  Dr. Frankey Shown first appointment is 1/8.  Patient wanted to know if there was another doctor she could be referred to sooner as she wants to get the procedure done before the new year due to insurance reasons.  Please call patient and advise.  Thank you.

## 2022-11-28 NOTE — Telephone Encounter (Signed)
DOB verified. Pt reports that UTI symptoms have improved. Advised pt that she should return in one week for repeat urine due to amount of blood that showed on her POCT.  Pt provided with appt. Advised that treatment with metrogel or flagyl is recommended to treat BV. Pt prefers flagyl. Advised no alcohol while taking medication and to let us know if symptoms do not resolve. Pt verbalized understanding.

## 2022-12-05 NOTE — Addendum Note (Signed)
Addended by: Jerene Bears on: 12/05/2022 06:33 AM   Modules accepted: Orders

## 2022-12-06 ENCOUNTER — Ambulatory Visit: Payer: BC Managed Care – PPO | Admitting: Gastroenterology

## 2022-12-12 ENCOUNTER — Other Ambulatory Visit (HOSPITAL_BASED_OUTPATIENT_CLINIC_OR_DEPARTMENT_OTHER): Payer: BC Managed Care – PPO

## 2022-12-12 DIAGNOSIS — R3129 Other microscopic hematuria: Secondary | ICD-10-CM

## 2022-12-12 DIAGNOSIS — N3090 Cystitis, unspecified without hematuria: Secondary | ICD-10-CM | POA: Diagnosis not present

## 2022-12-13 LAB — URINALYSIS, MICROSCOPIC ONLY
Casts: NONE SEEN /[LPF]
WBC, UA: NONE SEEN /[HPF] (ref 0–5)

## 2022-12-14 LAB — URINE CULTURE: Organism ID, Bacteria: NO GROWTH

## 2022-12-19 ENCOUNTER — Encounter: Payer: Self-pay | Admitting: Gastroenterology

## 2022-12-19 ENCOUNTER — Telehealth: Payer: Self-pay | Admitting: Gastroenterology

## 2022-12-19 NOTE — Telephone Encounter (Signed)
Inbound call from patient requesting to speak about recent mychart message. States she is having a hard time with her hernia and requesting to know if there is a sooner appointment with Dr. Barron Alvine. Please advise, thank you.

## 2022-12-19 NOTE — Telephone Encounter (Signed)
Patient was previously scheduled to see Dr Barron Alvine on 12/06/22 but had to be cancelled due to provider schedule conflict. I have rescheduled patient to Friday, 12/23/22 to discuss TIF. Patient verbalizes understanding and thanked me for the sooner appointment.

## 2022-12-19 NOTE — Telephone Encounter (Signed)
Spoke to patient by phone (she called in as well). Patient has been rescheduled from November appointment to this Friday, 12/23/22 to discuss TIF with Dr Barron Alvine. She verbalizes understanding.

## 2022-12-23 ENCOUNTER — Encounter: Payer: Self-pay | Admitting: Gastroenterology

## 2022-12-23 ENCOUNTER — Ambulatory Visit (INDEPENDENT_AMBULATORY_CARE_PROVIDER_SITE_OTHER): Payer: BC Managed Care – PPO | Admitting: Gastroenterology

## 2022-12-23 VITALS — BP 134/90 | HR 80 | Ht 62.0 in | Wt 141.5 lb

## 2022-12-23 DIAGNOSIS — K219 Gastro-esophageal reflux disease without esophagitis: Secondary | ICD-10-CM | POA: Diagnosis not present

## 2022-12-23 DIAGNOSIS — K449 Diaphragmatic hernia without obstruction or gangrene: Secondary | ICD-10-CM

## 2022-12-23 NOTE — Progress Notes (Signed)
Chief Complaint: GERD, hiatal hernia, discuss antireflux surgical options   Referring Provider:     Dr. Adela Lank    HPI:     Erika Frazier is a 42 y.o. female referred to me by Dr. Adela Lank for evaluation of possible antireflux intervention with Transoral Incisionless Fundoplication (TIF) with a goal to stop or significantly reduce acid suppression therapy.  Longstanding history of GERD with index symptoms of pyrosis, regurgitation, cough, globus sensation.  Does have a history of recurrent dysphagia.  EGD in 09/2021 with normal-appearing esophagus and performed empiric esophageal dilation with 17 mm Savary with appropriate mucosal disruption at the UES with good clinical response.  Subsequent barium esophagram with normal motility, but no overt refluxate.  No nausea/vomiting.   GERD history: -Index symptoms: pyrosis, regurgitation, cough, globus sensation, dyspepsia -Exacerbating features: Supine, forward flexion regurgitation. Tomato-based sauces -Medications trialed: Pantoprazole, omeprazole, Carafate -Current medications: Omeprazole 40 mg twice daily, carafate prn. Rare Tums or Mylanta for breakthrough -Complications: hiatal hernia  GERD evaluation: -Last EGD: 08/2022 -Barium esophagram: 11/23/2021: Normal -Esophageal Manometry: -pH/Impedance: None -Bravo: 08/2022: (off PPI): Significant reflux with DeMeester score 24.4, pH <4 in 7.2% of the time  Endoscopic History: - EGD 01/07/20: Dr. Octaviano Glow Normal esophagus, biopsied (no EOE). Empiric dilation to 17mm performed. Normal stomach. Duodenum normal. Biopsies taken. - EGD 10/18/2021: Hill grade 3 valve, normal esophagus.  Empiric esophageal dilation with 17 mm Savary with mucosal disruption at UES.  Normal stomach.  3 mm benign duodenal polyp, otherwise normal duodenum - EGD 09/28/2022: 1 cm hiatal hernia, Hill grade 3 valve, esophageal dilation with 17 mm Savary with appropriate mucosal disruption at UES  consistent with subtle stricture.  Normal stomach and duodenum.  Bravo placed in distal esophagus    Past Medical History:  Diagnosis Date   Allergy    Anxiety    COVID-19 04/27/2021   Depression    Fibroids, subserous    GERD (gastroesophageal reflux disease)    History of ovarian cyst    Hypertension    IDA (iron deficiency anemia)    Microcytic anemia    Mild persistent asthma    Wears glasses      Past Surgical History:  Procedure Laterality Date   COLONOSCOPY  08/20/2020   @ [facility]   ESOPHAGOGASTRODUODENOSCOPY  01/07/2020   @ [facility]   HYSTEROSCOPY N/A 10/13/2020   Procedure: HYSTEROSCOPY, DILATION AND CURETTAGE;  Surgeon: Jerene Bears, MD;  Location: [facility] [place];  Service: Gynecology;  Laterality: N/A;   Family History  Problem Relation Age of Onset   Hypertension Mother    Alcohol abuse Father        now in remission   Lupus Maternal Aunt    Heart disease Maternal Aunt    Depression Maternal Aunt    Colon cancer Maternal Great-grandmother    Breast cancer Neg Hx    Rectal cancer Neg Hx    Stomach cancer Neg Hx    Esophageal cancer Neg Hx    Social History   Tobacco Use   Smoking status: Former    Types: Cigars   Smokeless tobacco: Never   Tobacco comments:    Doesn't smoke tobacco  Vaping Use   Vaping status: Never Used  Substance Use Topics   Alcohol use: Yes    Alcohol/week: 4.0 standard drinks of alcohol    Types: 2 Glasses of wine, 2 Shots of liquor per week  Comment: occasional   Drug use: Yes    Frequency: 2.0 times per week    Types: Marijuana    Comment: 10-08-2020  per pt smokes average 3 times per week   Current Outpatient Medications  Medication Sig Dispense Refill   albuterol (PROVENTIL) (2.5 MG/3ML) 0.083% nebulizer solution Take 3 mLs (2.5 mg total) by nebulization every 6 (six) hours as needed for wheezing or shortness of breath. 150 mL 1   albuterol (VENTOLIN HFA) 108 (90 Base)  MCG/ACT inhaler Inhale 2 puffs into the lungs every 6 (six) hours as needed. 1 each 2   Calcium Carb-Cholecalciferol (CALCIUM + D3 PO) Take by mouth every other day. PER PT TAKES TWO TABLESPOONS QOD     ferrous sulfate (FER-IN-SOL) 75 (15 Fe) MG/ML SOLN Take by mouth every other day. PER PT ONE OUNCE QOD     methocarbamol (ROBAXIN) 500 MG tablet TAKE 1 TO 2 TABLETS(500 TO 1000 MG) BY MOUTH EVERY 8 HOURS AS NEEDED FOR MUSCLE SPASMS OR HEADACHE OR PAIN 60 tablet 2   mirtazapine (REMERON) 15 MG tablet TAKE 1 TABLET(15 MG) BY MOUTH AT BEDTIME 90 tablet 0   omeprazole (PRILOSEC) 40 MG capsule Take 40 mg by mouth 2 (two) times daily.     sucralfate (CARAFATE) 1 GM/10ML suspension Take 10 mLs (1 g total) by mouth every 6 (six) hours as needed. 420 mL 1   fluticasone (FLOVENT HFA) 110 MCG/ACT inhaler Inhale 2 puffs into the lungs 2 (two) times daily as needed. Prescribed twice daily but pt is only using as needed (Patient not taking: Reported on 12/23/2022) 1 each 2   No current facility-administered medications for this visit.   No Known Allergies   Review of Systems: All systems reviewed and negative except where noted in HPI.     Physical Exam:    Wt Readings from Last 3 Encounters:  12/23/22 141 lb 8 oz (64.2 kg)  11/23/22 140 lb 9.6 oz (63.8 kg)  09/28/22 132 lb (59.9 kg)    BP (!) 134/90 (BP Location: Left Arm, Patient Position: Sitting, Cuff Size: Normal)   Pulse 80   Ht 5\' 2"  (1.575 m)   Wt 141 lb 8 oz (64.2 kg)   LMP 12/04/2022   BMI 25.88 kg/m  Constitutional:  Pleasant, in no acute distress. Psychiatric: Normal mood and affect. Behavior is normal. Neurological: Alert and oriented to person place and time. Skin: Skin is warm and dry. No rashes noted.   ASSESSMENT AND PLAN;   1) GERD 2) Hiatal hernia Erika Frazier is a 42 y.o. female referred to me to discuss GERD and treatment options. Discussed the pathophysiology of GERD at length, to include the risks of untreated  reflux (ie, strictures, Barrett's Esophagus, EAC, etc) as well as the possible treatment with medications vs antireflux surgery. In particular, we discussed the risks, benefits, and alternatives of Transoral Incisionless Fundoplication (TIF), to include Nissen fundoplication and Linx.  She has clear objective evidence of reflux on her Bravo study.  No dysmotility noted on esophagram.  No clinical dysphagia.  No history of diabetes and no nausea/vomiting to prompt further evaluation with GES at this juncture.  I reviewed the images from each of her previous endoscopies.  Given the advanced Hill grade valve and degree of LES laxity, I recommend concomitant laparoscopic hiatal hernia/crural repair along with fundoplication.  We discussed each of these options at length, and the patient wishes to proceed with concomitant laparoscopic hiatal hernia repair and TIF. Will proceed  as below:   - Referral to the surgical clinic for discussion of concomitant laparoscopic hiatal hernia/crural repair - Continue Omeprazole 40 mg bid - Continue antireflux lifestyle/dietary modifications with avoidance of exacerbating type foods - Resume acid suppression therapy for now  - Discussed the strict post-procedure diet, to include clears x24 hours then liquids x2 weeks and slow advancement to pureed, thick, then finally previous foods 6 weeks post operatively  - Discussed the activity limitations for the initial 6 weeks post operatively    I spent 40 minutes of time, including in depth chart review, independent review of results as outlined above, communicating results with the patient directly, face-to-face time with the patient, coordinating care, ordering studies and medications as appropriate, and documentation.    Shellia Cleverly, DO, FACG  12/23/2022, 11:43 AM   Jarold Motto, PA

## 2022-12-23 NOTE — Patient Instructions (Signed)
Dr. Frankey Shown nurse will contact you regarding scheduling your surgery.  _______________________________________________________  If your blood pressure at your visit was 140/90 or greater, please contact your primary care physician to follow up on this.  _______________________________________________________  If you are age 41 or older, your body mass index should be between 23-30. Your Body mass index is 25.88 kg/m. If this is out of the aforementioned range listed, please consider follow up with your Primary Care Provider.  If you are age 5 or younger, your body mass index should be between 19-25. Your Body mass index is 25.88 kg/m. If this is out of the aformentioned range listed, please consider follow up with your Primary Care Provider.   ________________________________________________________  The [facility] providers would like to encourage you to use [facility] to communicate with providers for non-urgent requests or questions.  Due to long hold times on the telephone, sending your provider a message by [facility] may be a faster and more efficient way to get a response.  Please allow 48 business hours for a response.  Please remember that this is for non-urgent requests.  _______________________________________________________

## 2022-12-26 ENCOUNTER — Ambulatory Visit: Payer: BC Managed Care – PPO | Admitting: Physician Assistant

## 2022-12-31 ENCOUNTER — Other Ambulatory Visit: Payer: Self-pay | Admitting: Physician Assistant

## 2023-01-03 ENCOUNTER — Ambulatory Visit: Payer: BC Managed Care – PPO | Admitting: Gastroenterology

## 2023-01-05 DIAGNOSIS — K219 Gastro-esophageal reflux disease without esophagitis: Secondary | ICD-10-CM | POA: Diagnosis not present

## 2023-01-05 DIAGNOSIS — K449 Diaphragmatic hernia without obstruction or gangrene: Secondary | ICD-10-CM | POA: Diagnosis not present

## 2023-01-16 ENCOUNTER — Telehealth: Payer: Self-pay | Admitting: Gastroenterology

## 2023-01-16 NOTE — Telephone Encounter (Signed)
PT is calling to find out if Dr. Barron Alvine cancelled his portion of her hiatal hernia surgery. Please advise.

## 2023-01-16 NOTE — Telephone Encounter (Signed)
I have spoken to patient to advise that unfortunately, Dr Barron Alvine would be unable to complete cTIF on 02/10/23 and would instead have to complete the procedure in February (staffing shortages and previous scheduling constraints). [facility] was advised of this last week, so they may be working to get a different date coordinated for her surgery, although she says she has not heard from their office again since being given 02/10/23 appointment date.   Patient states "my hernia is bothering me so bad." She would like to know if she would be able to move forward with hiatal hernia repair as scheduled with Dr Andrey Campanile on 02/10/23 and instead have TIF at a later date (February opening).  Dr C, would this be a possibility? I did discuss with patient that if this were possible, we would likely need to resubmit approval request with her insurance to perform TIF independently of [facility] repair which would likely increase wait times.

## 2023-01-16 NOTE — Progress Notes (Signed)
Sent message, via epic in basket, requesting orders in epic from surgeon.  

## 2023-01-16 NOTE — Telephone Encounter (Signed)
I have do not see any FMLA paperwork in my inbox, not sure where this is, if she has a copy can you ask her to fax it or drop it off? thanks

## 2023-01-16 NOTE — Telephone Encounter (Signed)
Patient called and stated that she had faxed over [facility] paperwork in October and is needing it back November 22 . Also she stated that the document must include her short term leave and long term leave. Patient is requesting a call back. Please advise.

## 2023-01-16 NOTE — Telephone Encounter (Signed)
Dr Adela Lank,   Have you seen any FMLA paperwork come across your desk on this patient (I do not see any currently in your inbox for this patient)?

## 2023-01-16 NOTE — Telephone Encounter (Signed)
Patient states that she had information faxed to the number she had on file for [name] (unable to tell me which number this is). She says she will attach copies of forms in her mychart for our review.

## 2023-01-16 NOTE — Telephone Encounter (Signed)
Left message advising patient that unfortunately, Dr Adela Lank, myself, Jan nor our front staff have seen FMLA forms. If she has a copy, we will be glad to take a look at it and try to fill it out as soon as possible. Asked that patient call back.

## 2023-01-16 NOTE — Telephone Encounter (Signed)
It appeared that we could not get the patient added on for December 13 due to potential endoscopy staff limitation.  The patient had called our office asking if she could just proceed with hiatal hernia repair.  I discussed the matter with Dr. Barron Alvine as to doing a staged procedure.  We both felt that from a patient recovery, cost standpoint it did not make sense to do a staged repair when her hiatal hernia is so small.  I then called the patient to explain why we should not do a staged procedure.  She states that she felt a lot of pressure in her upper abdomen and she felt that her hiatal hernia was causing a significant issue.  I explained that her hiatal hernia is actually quite small.  That she in fact has more of a laxity in her diaphragm then an overt hiatal hernia per se.  And that I would be surprised if her laxity in her diaphragm is contributing to her current symptoms.  She was tearful at times that we could not proceed this year.  I encouraged her to reach out to GI to see if there is perhaps additional medication that may could control her discomfort.  I explained that even if we did proceed with hiatal hernia repair as a staged procedure I just do not think she would get much benefit from it from a clinical standpoint until she has a fundoplication performed since her hiatal hernia is so small.  Patient was also disappointed that her insurance resets in January and she may have to wait until her deductible is met until she can afford surgery.

## 2023-01-16 NOTE — Telephone Encounter (Signed)
I think this pt sees [facility]

## 2023-01-18 NOTE — Telephone Encounter (Signed)
FMLA forms received and placed on Dr Lanetta Inch desk for review.

## 2023-01-19 ENCOUNTER — Ambulatory Visit: Payer: Self-pay | Admitting: General Surgery

## 2023-01-19 DIAGNOSIS — N920 Excessive and frequent menstruation with regular cycle: Secondary | ICD-10-CM

## 2023-01-19 NOTE — Telephone Encounter (Signed)
Dr Adela Lank has filled out FMLA forms and signed. Forms faxed back to [employer].

## 2023-01-19 NOTE — Progress Notes (Signed)
Second request for pre op orders sent message via [facility] to Dr. Andrey Campanile.

## 2023-01-20 NOTE — Progress Notes (Signed)
COVID Vaccine Completed: yes  Date of COVID positive in last 90 days:  PCP - Jarold Motto, PA Cardiologist -   Chest x-ray -  EKG -  Stress Test -  ECHO -  Cardiac Cath -  Pacemaker/ICD device last checked: Spinal Cord Stimulator:  Bowel Prep -   Sleep Study -  CPAP -   Fasting Blood Sugar -  Checks Blood Sugar _____ times a day  Last dose of GLP1 agonist-  N/A GLP1 instructions:  Hold 7 days before surgery    Last dose of SGLT-2 inhibitors-  N/A SGLT-2 instructions:  Hold 3 days before surgery    Blood Thinner Instructions:  Time Aspirin Instructions: Last Dose:  Activity level:  Can go up a flight of stairs and perform activities of daily living without stopping and without symptoms of chest pain or shortness of breath.  Able to exercise without symptoms  Unable to go up a flight of stairs without symptoms of     Anesthesia review:   Patient denies shortness of breath, fever, cough and chest pain at PAT appointment  Patient verbalized understanding of instructions that were given to them at the PAT appointment. Patient was also instructed that they will need to review over the PAT instructions again at home before surgery.

## 2023-01-20 NOTE — Patient Instructions (Signed)
SURGICAL WAITING ROOM VISITATION  Patients having surgery or a procedure may have no more than 2 support people in the waiting area - these visitors may rotate.    Children under the age of 7 must have an adult with them who is not the patient.  Due to an increase in RSV and influenza rates and associated hospitalizations, children ages 72 and under may not visit patients in [facility] hospitals.  If the patient needs to stay at the hospital during part of their recovery, the visitor guidelines for inpatient rooms apply. Pre-op nurse will coordinate an appropriate time for 1 support person to accompany patient in pre-op.  This support person may not rotate.    Please refer to the [facility] website for the visitor guidelines for Inpatients (after your surgery is over and you are in a regular room).    Your procedure is scheduled on: 02/10/23   Report to [facility] Main Entrance    Report to admitting at 9:15 AM   Call this number if you have problems the morning of surgery [phone]   Do not eat food :After Midnight.   After Midnight you may have the following liquids until 8:30 AM DAY OF SURGERY  Water Non-Citrus Juices (without pulp, NO RED-Apple, White grape, White cranberry) Black Coffee (NO MILK/CREAM OR CREAMERS, sugar ok)  Clear Tea (NO MILK/CREAM OR CREAMERS, sugar ok) regular and decaf                             Plain Jell-O (NO RED)                                           Fruit ices (not with fruit pulp, NO RED)                                     Popsicles (NO RED)                                                               Sports drinks like Gatorade (NO RED)                 The day of surgery:  Drink ONE (1) Pre-Surgery Clear Ensure at 8:30 AM the morning of surgery. Drink in one sitting. Do not sip.  This drink was given to you during your hospital  pre-op appointment visit. Nothing else to drink after completing the  Pre-Surgery Clear  Ensure.          If you have questions, please contact your surgeon's office.   FOLLOW BOWEL PREP AND ANY ADDITIONAL PRE OP INSTRUCTIONS YOU RECEIVED FROM YOUR SURGEON'S OFFICE!!!     Oral Hygiene is also important to reduce your risk of infection.                                    Remember - BRUSH YOUR TEETH THE MORNING OF SURGERY WITH YOUR REGULAR TOOTHPASTE  DENTURES WILL  BE REMOVED PRIOR TO SURGERY PLEASE DO NOT APPLY "Poly grip" OR ADHESIVES!!!   Stop all vitamins and herbal supplements 7 days before surgery.   Take these medicines the morning of surgery with A SIP OF WATER: Albuterol ,Omeprazole                              You may not have any metal on your body including hair pins, jewelry, and body piercing             Do not wear make-up, lotions, powders, perfumes, or deodorant  Do not wear nail polish including gel and S&S, artificial/acrylic nails, or any other type of covering on natural nails including finger and toenails. If you have artificial nails, gel coating, etc. that needs to be removed by a nail salon please have this removed prior to surgery or surgery may need to be canceled/ delayed if the surgeon/ anesthesia feels like they are unable to be safely monitored.   Do not shave  48 hours prior to surgery.    Do not bring valuables to the hospital. [facility] IS NOT             RESPONSIBLE   FOR VALUABLES.   Contacts, glasses, dentures or bridgework may not be worn into surgery.   Bring small overnight bag day of surgery.   DO NOT BRING YOUR HOME MEDICATIONS TO THE HOSPITAL. PHARMACY WILL DISPENSE MEDICATIONS LISTED ON YOUR MEDICATION LIST TO YOU DURING YOUR ADMISSION IN THE HOSPITAL!              Please read over the following fact sheets you were given: IF YOU HAVE QUESTIONS ABOUT YOUR PRE-OP INSTRUCTIONS PLEASE CALL [name]    If you received a COVID test during your pre-op visit  it is requested that you wear a mask when out in public,  stay away from anyone that may not be feeling well and notify your surgeon if you develop symptoms. If you test positive for Covid or have been in contact with anyone that has tested positive in the last 10 days please notify you surgeon.    [facility] - Preparing for Surgery Before surgery, you can play an important role.  Because skin is not sterile, your skin needs to be as free of germs as possible.  You can reduce the number of germs on your skin by washing with CHG (chlorahexidine gluconate) soap before surgery.  CHG is an antiseptic cleaner which kills germs and bonds with the skin to continue killing germs even after washing. Please DO NOT use if you have an allergy to CHG or antibacterial soaps.  If your skin becomes reddened/irritated stop using the CHG and inform your nurse when you arrive at Short Stay. Do not shave (including legs and underarms) for at least 48 hours prior to the first CHG shower.  You may shave your face/neck.  Please follow these instructions carefully:  1.  Shower with CHG Soap the night before surgery and the  morning of surgery.  2.  If you choose to wash your hair, wash your hair first as usual with your normal  shampoo.  3.  After you shampoo, rinse your hair and body thoroughly to remove the shampoo.                             4.  Use CHG  as you would any other liquid soap.  You can apply chg directly to the skin and wash.  Gently with a scrungie or clean washcloth.  5.  Apply the CHG Soap to your body ONLY FROM THE NECK DOWN.   Do   not use on face/ open                           Wound or open sores. Avoid contact with eyes, ears mouth and   genitals (private parts).                       Wash face,  Genitals (private parts) with your normal soap.             6.  Wash thoroughly, paying special attention to the area where your    surgery  will be performed.  7.  Thoroughly rinse your body with warm water from the neck down.  8.  DO NOT shower/wash with your  normal soap after using and rinsing off the CHG Soap.                9.  Pat yourself dry with a clean towel.            10.  Wear clean pajamas.            11.  Place clean sheets on your bed the night of your first shower and do not  sleep with pets. Day of Surgery : Do not apply any lotions/deodorants the morning of surgery.  Please wear clean clothes to the hospital/surgery center.  FAILURE TO FOLLOW THESE INSTRUCTIONS MAY RESULT IN THE CANCELLATION OF YOUR SURGERY  PATIENT SIGNATURE_________________________________  NURSE SIGNATURE__________________________________  ________________________________________________________________________

## 2023-01-23 ENCOUNTER — Other Ambulatory Visit: Payer: Self-pay

## 2023-01-23 ENCOUNTER — Encounter (HOSPITAL_COMMUNITY): Payer: Self-pay

## 2023-01-23 ENCOUNTER — Encounter (HOSPITAL_COMMUNITY)
Admission: RE | Admit: 2023-01-23 | Discharge: 2023-01-23 | Disposition: A | Discharge status: Home / Self Care | Payer: BC Managed Care – PPO | Source: Ambulatory Visit | Attending: General Surgery | Admitting: General Surgery

## 2023-01-23 VITALS — BP 134/94 | HR 86 | Temp 98.4°F | Resp 16 | Ht 62.0 in | Wt 141.0 lb

## 2023-01-23 DIAGNOSIS — I10 Essential (primary) hypertension: Secondary | ICD-10-CM | POA: Diagnosis not present

## 2023-01-23 DIAGNOSIS — N920 Excessive and frequent menstruation with regular cycle: Secondary | ICD-10-CM

## 2023-01-23 DIAGNOSIS — Z01818 Encounter for other preprocedural examination: Secondary | ICD-10-CM | POA: Diagnosis not present

## 2023-01-23 LAB — CBC WITH DIFFERENTIAL/PLATELET
Abs Immature Granulocytes: 0.01 10*3/uL (ref 0.00–0.07)
Basophils Absolute: 0 10*3/uL (ref 0.0–0.1)
Basophils Relative: 1 %
Eosinophils Absolute: 0.1 10*3/uL (ref 0.0–0.5)
Eosinophils Relative: 1 %
HCT: 37.1 % (ref 36.0–46.0)
Hemoglobin: 11.3 g/dL — ABNORMAL LOW (ref 12.0–15.0)
Immature Granulocytes: 0 %
Lymphocytes Relative: 30 %
Lymphs Abs: 1.7 10*3/uL (ref 0.7–4.0)
MCH: 23.8 pg — ABNORMAL LOW (ref 26.0–34.0)
MCHC: 30.5 g/dL (ref 30.0–36.0)
MCV: 78.1 fL — ABNORMAL LOW (ref 80.0–100.0)
Monocytes Absolute: 0.4 10*3/uL (ref 0.1–1.0)
Monocytes Relative: 7 %
Neutro Abs: 3.4 10*3/uL (ref 1.7–7.7)
Neutrophils Relative %: 61 %
Platelets: 387 10*3/uL (ref 150–400)
RBC: 4.75 MIL/uL (ref 3.87–5.11)
RDW: 14.3 % (ref 11.5–15.5)
WBC: 5.5 10*3/uL (ref 4.0–10.5)
nRBC: 0 % (ref 0.0–0.2)

## 2023-01-23 LAB — COMPREHENSIVE METABOLIC PANEL
ALT: 14 U/L (ref 0–44)
AST: 15 U/L (ref 15–41)
Albumin: 4 g/dL (ref 3.5–5.0)
Alkaline Phosphatase: 61 U/L (ref 38–126)
Anion gap: 4 — ABNORMAL LOW (ref 5–15)
BUN: 15 mg/dL (ref 6–20)
CO2: 27 mmol/L (ref 22–32)
Calcium: 9 mg/dL (ref 8.9–10.3)
Chloride: 105 mmol/L (ref 98–111)
Creatinine, Ser: 0.89 mg/dL (ref 0.44–1.00)
GFR, Estimated: >60 mL/min (ref >60)
Glucose, Bld: 94 mg/dL (ref 70–99)
Potassium: 3.8 mmol/L (ref 3.5–5.1)
Sodium: 136 mmol/L (ref 135–145)
Total Bilirubin: 0.5 mg/dL (ref <1.2)
Total Protein: 6.9 g/dL (ref 6.5–8.1)

## 2023-01-30 ENCOUNTER — Other Ambulatory Visit: Payer: Self-pay

## 2023-01-30 MED ORDER — OMEPRAZOLE 40 MG PO CPDR: 40.0000 mg | DELAYED_RELEASE_CAPSULE | Freq: Two times a day (BID) | ORAL | 1 refills | Status: AC

## 2023-02-09 ENCOUNTER — Encounter (HOSPITAL_COMMUNITY): Payer: Self-pay | Admitting: General Surgery

## 2023-02-09 NOTE — Anesthesia Preprocedure Evaluation (Addendum)
Anesthesia Evaluation  Patient identified by MRN, date of birth, ID band Patient awake    Reviewed: Allergy & Precautions, NPO status , Patient's Chart, lab work & pertinent test results  Airway Mallampati: I  TM Distance: >3 FB Neck ROM: Full    Dental no notable dental hx.    Pulmonary asthma , former smoker   Pulmonary exam normal breath sounds clear to auscultation       Cardiovascular hypertension, Pt. on medications Normal cardiovascular exam Rhythm:Regular Rate:Normal     Neuro/Psych  PSYCHIATRIC DISORDERS Anxiety Depression       GI/Hepatic ,GERD  Medicated and Controlled,,  Endo/Other  negative endocrine ROS    Renal/GU Lab Results      Component                Value               Date                     K                        3.8                 01/23/2023                CO2                      27                  01/23/2023                BUN                      15                  01/23/2023                CREATININE               0.89                01/23/2023                  negative genitourinary   Musculoskeletal negative musculoskeletal ROS (+)    Abdominal   Peds  Hematology  (+) Blood dyscrasia, anemia Lab Results      Component                Value               Date                      WBC                      5.5                 01/23/2023                HGB                      11.3 (L)            01/23/2023                HCT  37.1                01/23/2023                MCV                      78.1 (L)            01/23/2023                PLT                      387                 01/23/2023              Anesthesia Other Findings   Reproductive/Obstetrics negative OB ROS                              Anesthesia Physical Anesthesia Plan  ASA: 2  Anesthesia Plan: General   Post-op Pain Management: Toradol IV (intra-op)* and  Ketamine IV*   Induction: Intravenous  PONV Risk Score and Plan: 4 or greater and Treatment may vary due to age or medical condition, Midazolam, Ondansetron, Dexamethasone and Scopolamine patch - Pre-op  Airway Management Planned: Oral ETT  Additional Equipment: None  Intra-op Plan:   Post-operative Plan: Extubation in OR  Informed Consent: I have reviewed the patients History and Physical, chart, labs and discussed the procedure including the risks, benefits and alternatives for the proposed anesthesia with the patient or authorized representative who has indicated his/her understanding and acceptance.     Dental advisory given  Plan Discussed with: CRNA and Anesthesiologist  Anesthesia Plan Comments:          Anesthesia Quick Evaluation

## 2023-02-10 ENCOUNTER — Ambulatory Visit (HOSPITAL_COMMUNITY): Payer: BC Managed Care – PPO | Admitting: Anesthesiology

## 2023-02-10 ENCOUNTER — Other Ambulatory Visit: Payer: Self-pay

## 2023-02-10 ENCOUNTER — Encounter (HOSPITAL_COMMUNITY)
Admission: RE | Disposition: A | Discharge status: Home / Self Care | Payer: Self-pay | Source: Home / Self Care | Attending: General Surgery

## 2023-02-10 ENCOUNTER — Encounter (HOSPITAL_COMMUNITY): Payer: Self-pay | Admitting: General Surgery

## 2023-02-10 ENCOUNTER — Ambulatory Visit (HOSPITAL_COMMUNITY)
Admission: RE | Admit: 2023-02-10 | Discharge: 2023-02-11 | Disposition: A | Discharge status: Home / Self Care | Payer: BC Managed Care – PPO | Attending: General Surgery | Admitting: General Surgery

## 2023-02-10 DIAGNOSIS — Z87891 Personal history of nicotine dependence: Secondary | ICD-10-CM | POA: Diagnosis not present

## 2023-02-10 DIAGNOSIS — Z9889 Other specified postprocedural states: Secondary | ICD-10-CM | POA: Diagnosis present

## 2023-02-10 DIAGNOSIS — K449 Diaphragmatic hernia without obstruction or gangrene: Secondary | ICD-10-CM | POA: Insufficient documentation

## 2023-02-10 DIAGNOSIS — I10 Essential (primary) hypertension: Secondary | ICD-10-CM | POA: Diagnosis not present

## 2023-02-10 DIAGNOSIS — F1290 Cannabis use, unspecified, uncomplicated: Secondary | ICD-10-CM | POA: Diagnosis not present

## 2023-02-10 DIAGNOSIS — K219 Gastro-esophageal reflux disease without esophagitis: Secondary | ICD-10-CM | POA: Diagnosis not present

## 2023-02-10 DIAGNOSIS — J4530 Mild persistent asthma, uncomplicated: Secondary | ICD-10-CM | POA: Insufficient documentation

## 2023-02-10 HISTORY — PX: HIATAL HERNIA REPAIR: SHX195

## 2023-02-10 LAB — POCT PREGNANCY, URINE: Preg Test, Ur: NEGATIVE

## 2023-02-10 SURGERY — REPAIR, HERNIA, HIATAL, LAPAROSCOPIC
Anesthesia: General

## 2023-02-10 MED ORDER — BUPIVACAINE LIPOSOME 1.3 % IJ SUSP: 20.0000 mL | Freq: Once | INTRAMUSCULAR | Status: DC

## 2023-02-10 MED ORDER — CEFAZOLIN SODIUM-DEXTROSE 2-4 GM/100ML-% IV SOLN
2.0000 g | INTRAVENOUS | Status: AC
  Administered 2023-02-10: 2 g via INTRAVENOUS
  Filled 2023-02-10: qty 100

## 2023-02-10 MED ORDER — SUGAMMADEX SODIUM 200 MG/2ML IV SOLN
INTRAVENOUS | Status: DC | PRN
  Administered 2023-02-10: 300 mg via INTRAVENOUS

## 2023-02-10 MED ORDER — ONDANSETRON HCL 4 MG PO TABS: 4.0000 mg | ORAL_TABLET | Freq: Four times a day (QID) | ORAL | 1 refills | Status: DC | PRN

## 2023-02-10 MED ORDER — ONDANSETRON HCL 4 MG/2ML IJ SOLN
INTRAMUSCULAR | Status: DC | PRN
  Administered 2023-02-10: 4 mg via INTRAVENOUS

## 2023-02-10 MED ORDER — DEXMEDETOMIDINE HCL IN NACL 80 MCG/20ML IV SOLN
INTRAVENOUS | Status: DC | PRN
  Administered 2023-02-10 (×2): 8 ug via INTRAVENOUS
  Administered 2023-02-10: 4 ug via INTRAVENOUS

## 2023-02-10 MED ORDER — PROPOFOL 10 MG/ML IV BOLUS
INTRAVENOUS | Status: DC | PRN
  Administered 2023-02-10: 180 mg via INTRAVENOUS

## 2023-02-10 MED ORDER — ACETAMINOPHEN 500 MG PO TABS
1000.0000 mg | ORAL_TABLET | Freq: Four times a day (QID) | ORAL | Status: DC
  Administered 2023-02-10 – 2023-02-11 (×3): 1000 mg via ORAL
  Filled 2023-02-10 (×3): qty 2

## 2023-02-10 MED ORDER — ACETAMINOPHEN 500 MG PO TABS
1000.0000 mg | ORAL_TABLET | ORAL | Status: AC
  Administered 2023-02-10: 1000 mg via ORAL
  Filled 2023-02-10: qty 2

## 2023-02-10 MED ORDER — ONDANSETRON HCL 4 MG/2ML IJ SOLN: 4.0000 mg | Freq: Four times a day (QID) | INTRAMUSCULAR | Status: DC | PRN

## 2023-02-10 MED ORDER — LIDOCAINE 2% (20 MG/ML) 5 ML SYRINGE
INTRAMUSCULAR | Status: DC | PRN
  Administered 2023-02-10: 100 mg via INTRAVENOUS

## 2023-02-10 MED ORDER — OXYCODONE HCL 5 MG/5ML PO SOLN: 5.0000 mg | Freq: Once | ORAL | Status: DC | PRN

## 2023-02-10 MED ORDER — DIPHENHYDRAMINE HCL 50 MG/ML IJ SOLN
12.5000 mg | Freq: Four times a day (QID) | INTRAMUSCULAR | Status: DC | PRN
Start: 2023-02-10 — End: 2023-02-11

## 2023-02-10 MED ORDER — OXYCODONE HCL 5 MG PO TABS: 5.0000 mg | ORAL_TABLET | Freq: Four times a day (QID) | ORAL | 0 refills | Status: DC | PRN

## 2023-02-10 MED ORDER — KETOROLAC TROMETHAMINE 30 MG/ML IJ SOLN: 30.0000 mg | Freq: Once | INTRAMUSCULAR | Status: DC | PRN

## 2023-02-10 MED ORDER — DEXAMETHASONE SODIUM PHOSPHATE 4 MG/ML IJ SOLN: 4.0000 mg | INTRAMUSCULAR | Status: DC

## 2023-02-10 MED ORDER — ALBUTEROL SULFATE HFA 108 (90 BASE) MCG/ACT IN AERS: 2.0000 | INHALATION_SPRAY | Freq: Four times a day (QID) | RESPIRATORY_TRACT | Status: DC | PRN

## 2023-02-10 MED ORDER — CHLORHEXIDINE GLUCONATE 0.12 % MT SOLN
15.0000 mL | Freq: Once | OROMUCOSAL | Status: AC
Start: 2023-02-10 — End: 2023-02-10
  Administered 2023-02-10: 15 mL via OROMUCOSAL

## 2023-02-10 MED ORDER — FENTANYL CITRATE (PF) 100 MCG/2ML IJ SOLN
INTRAMUSCULAR | Status: DC | PRN
  Administered 2023-02-10 (×4): 50 ug via INTRAVENOUS

## 2023-02-10 MED ORDER — OXYCODONE HCL 5 MG PO TABS
5.0000 mg | ORAL_TABLET | ORAL | Status: DC | PRN
  Administered 2023-02-10 – 2023-02-11 (×4): 10 mg via ORAL
  Filled 2023-02-10 (×4): qty 2

## 2023-02-10 MED ORDER — DOCUSATE SODIUM 100 MG PO CAPS
100.0000 mg | ORAL_CAPSULE | Freq: Two times a day (BID) | ORAL | Status: DC
  Administered 2023-02-10 – 2023-02-11 (×2): 100 mg via ORAL
  Filled 2023-02-10 (×2): qty 1

## 2023-02-10 MED ORDER — ESMOLOL HCL 100 MG/10ML IV SOLN
INTRAVENOUS | Status: AC
  Filled 2023-02-10: qty 10

## 2023-02-10 MED ORDER — SODIUM CHLORIDE 0.9 % IV SOLN: 12.5000 mg | Freq: Four times a day (QID) | INTRAVENOUS | Status: DC | PRN

## 2023-02-10 MED ORDER — PANTOPRAZOLE SODIUM 40 MG IV SOLR
40.0000 mg | Freq: Two times a day (BID) | INTRAVENOUS | Status: DC
  Administered 2023-02-10 – 2023-02-11 (×2): 40 mg via INTRAVENOUS
  Filled 2023-02-10 (×2): qty 10

## 2023-02-10 MED ORDER — KETOROLAC TROMETHAMINE 30 MG/ML IJ SOLN
30.0000 mg | Freq: Three times a day (TID) | INTRAMUSCULAR | Status: DC
  Administered 2023-02-10 – 2023-02-11 (×2): 30 mg via INTRAVENOUS
  Filled 2023-02-10 (×2): qty 1

## 2023-02-10 MED ORDER — CHLORHEXIDINE GLUCONATE CLOTH 2 % EX PADS: 6.0000 | MEDICATED_PAD | Freq: Once | CUTANEOUS | Status: DC

## 2023-02-10 MED ORDER — SCOPOLAMINE 1 MG/3DAYS TD PT72
1.0000 | MEDICATED_PATCH | TRANSDERMAL | Status: DC
  Administered 2023-02-10: 1.5 mg via TRANSDERMAL
  Filled 2023-02-10: qty 1

## 2023-02-10 MED ORDER — OXYCODONE HCL 5 MG PO TABS: 5.0000 mg | ORAL_TABLET | Freq: Once | ORAL | Status: DC | PRN

## 2023-02-10 MED ORDER — DEXAMETHASONE SODIUM PHOSPHATE 10 MG/ML IJ SOLN
INTRAMUSCULAR | Status: DC | PRN
  Administered 2023-02-10: 10 mg via INTRAVENOUS

## 2023-02-10 MED ORDER — MIDAZOLAM HCL 5 MG/5ML IJ SOLN
INTRAMUSCULAR | Status: DC | PRN
  Administered 2023-02-10: 2 mg via INTRAVENOUS

## 2023-02-10 MED ORDER — HYDROMORPHONE HCL 1 MG/ML IJ SOLN
INTRAMUSCULAR | Status: AC
  Filled 2023-02-10: qty 1

## 2023-02-10 MED ORDER — BUPIVACAINE LIPOSOME 1.3 % IJ SUSP
INTRAMUSCULAR | Status: AC
  Filled 2023-02-10: qty 20

## 2023-02-10 MED ORDER — ENOXAPARIN SODIUM 40 MG/0.4ML IJ SOSY
40.0000 mg | PREFILLED_SYRINGE | INTRAMUSCULAR | Status: DC
  Administered 2023-02-11: 40 mg via SUBCUTANEOUS
  Filled 2023-02-10: qty 0.4

## 2023-02-10 MED ORDER — SODIUM CHLORIDE 0.9 % IR SOLN
Status: DC | PRN
  Administered 2023-02-10: 1000 mL

## 2023-02-10 MED ORDER — FENTANYL CITRATE (PF) 100 MCG/2ML IJ SOLN
INTRAMUSCULAR | Status: AC
  Filled 2023-02-10: qty 2

## 2023-02-10 MED ORDER — KETAMINE HCL 50 MG/5ML IJ SOSY
PREFILLED_SYRINGE | INTRAMUSCULAR | Status: AC
  Filled 2023-02-10: qty 5

## 2023-02-10 MED ORDER — BUPIVACAINE-EPINEPHRINE 0.25% -1:200000 IJ SOLN
INTRAMUSCULAR | Status: AC
  Filled 2023-02-10: qty 1

## 2023-02-10 MED ORDER — LACTATED RINGERS IV SOLN: INTRAVENOUS | Status: DC

## 2023-02-10 MED ORDER — ONDANSETRON 4 MG PO TBDP: 4.0000 mg | ORAL_TABLET | Freq: Four times a day (QID) | ORAL | Status: DC | PRN

## 2023-02-10 MED ORDER — SUCRALFATE 1 GM/10ML PO SUSP: 1.0000 g | Freq: Four times a day (QID) | ORAL | Status: DC | PRN

## 2023-02-10 MED ORDER — ROCURONIUM BROMIDE 10 MG/ML (PF) SYRINGE
PREFILLED_SYRINGE | INTRAVENOUS | Status: DC | PRN
  Administered 2023-02-10: 50 mg via INTRAVENOUS
  Administered 2023-02-10: 30 mg via INTRAVENOUS
  Administered 2023-02-10: 20 mg via INTRAVENOUS

## 2023-02-10 MED ORDER — KCL IN DEXTROSE-NACL 20-5-0.45 MEQ/L-%-% IV SOLN
INTRAVENOUS | Status: DC
  Filled 2023-02-10: qty 1000

## 2023-02-10 MED ORDER — MIDAZOLAM HCL 2 MG/2ML IJ SOLN
INTRAMUSCULAR | Status: AC
  Filled 2023-02-10: qty 2

## 2023-02-10 MED ORDER — DIPHENHYDRAMINE HCL 12.5 MG/5ML PO ELIX: 12.5000 mg | ORAL_SOLUTION | Freq: Four times a day (QID) | ORAL | Status: DC | PRN

## 2023-02-10 MED ORDER — ORAL CARE MOUTH RINSE: 15.0000 mL | Freq: Once | OROMUCOSAL | Status: AC

## 2023-02-10 MED ORDER — ONDANSETRON HCL 4 MG/2ML IJ SOLN: 4.0000 mg | Freq: Once | INTRAMUSCULAR | Status: DC | PRN

## 2023-02-10 MED ORDER — BUPIVACAINE-EPINEPHRINE 0.25% -1:200000 IJ SOLN
INTRAMUSCULAR | Status: DC | PRN
  Administered 2023-02-10: 50 mL

## 2023-02-10 MED ORDER — KETAMINE HCL 10 MG/ML IJ SOLN
INTRAMUSCULAR | Status: DC | PRN
  Administered 2023-02-10: 30 mg via INTRAVENOUS
  Administered 2023-02-10: 20 mg via INTRAVENOUS

## 2023-02-10 MED ORDER — HYDROMORPHONE HCL 1 MG/ML IJ SOLN
0.2500 mg | INTRAMUSCULAR | Status: DC | PRN
  Administered 2023-02-10 (×2): 0.5 mg via INTRAVENOUS

## 2023-02-10 MED ORDER — EPHEDRINE SULFATE-NACL 50-0.9 MG/10ML-% IV SOSY
PREFILLED_SYRINGE | INTRAVENOUS | Status: DC | PRN
  Administered 2023-02-10: 5 mg via INTRAVENOUS

## 2023-02-10 MED ORDER — HEPARIN SODIUM (PORCINE) 5000 UNIT/ML IJ SOLN
5000.0000 [IU] | Freq: Once | INTRAMUSCULAR | Status: AC
  Administered 2023-02-10: 5000 [IU] via SUBCUTANEOUS
  Filled 2023-02-10: qty 1

## 2023-02-10 MED ORDER — ROCURONIUM BROMIDE 10 MG/ML (PF) SYRINGE
PREFILLED_SYRINGE | INTRAVENOUS | Status: AC
  Filled 2023-02-10: qty 10

## 2023-02-10 MED ORDER — SIMETHICONE 80 MG PO CHEW
40.0000 mg | CHEWABLE_TABLET | Freq: Four times a day (QID) | ORAL | Status: DC | PRN
Start: 2023-02-10 — End: 2023-02-11
  Administered 2023-02-10: 40 mg via ORAL
  Filled 2023-02-10: qty 1

## 2023-02-10 SURGICAL SUPPLY — 57 items
ANTIFOG SOL W/FOAM PAD STRL (MISCELLANEOUS) ×1
APPLIER CLIP ROT 10 11.4 M/L (STAPLE)
BAG COUNTER SPONGE SURGICOUNT (BAG) IMPLANT
BLADE SURG SZ11 CARB STEEL (BLADE) ×1 IMPLANT
CABLE HIGH FREQUENCY MONO STRZ (ELECTRODE) IMPLANT
CHLORAPREP W/TINT 26 (MISCELLANEOUS) ×1 IMPLANT
CLIP APPLIE ROT 10 11.4 M/L (STAPLE) IMPLANT
DEVICE SUT QUICK LOAD TK 5 (SUTURE) ×1 IMPLANT
DEVICE SUT TI-KNOT TK 5X26 (SUTURE) ×1 IMPLANT
DEVICE SUTURE ENDOST 10MM (ENDOMECHANICALS) ×1 IMPLANT
DISSECTOR BLUNT TIP ENDO 5MM (MISCELLANEOUS) ×1 IMPLANT
DRAIN PENROSE 0.5X18 (DRAIN) IMPLANT
DRAPE UTILITY XL STRL (DRAPES) ×2 IMPLANT
DRSG TEGADERM 2-3/8X2-3/4 SM (GAUZE/BANDAGES/DRESSINGS) IMPLANT
ELECT REM PT RETURN 15FT ADLT (MISCELLANEOUS) ×1 IMPLANT
GAUZE 4X4 16PLY ~~LOC~~+RFID DBL (SPONGE) ×1 IMPLANT
GAUZE SPONGE 2X2 8PLY STRL LF (GAUZE/BANDAGES/DRESSINGS) IMPLANT
GLOVE BIO SURGEON STRL SZ7.5 (GLOVE) ×1 IMPLANT
GLOVE INDICATOR 8.0 STRL GRN (GLOVE) ×1 IMPLANT
GOWN STRL REUS W/ TWL XL LVL3 (GOWN DISPOSABLE) ×1 IMPLANT
GRASPER SUT TROCAR 14GX15 (MISCELLANEOUS) IMPLANT
IRRIG SUCT STRYKERFLOW 2 WTIP (MISCELLANEOUS) ×1
IRRIGATION SUCT STRKRFLW 2 WTP (MISCELLANEOUS) IMPLANT
KIT BASIN OR (CUSTOM PROCEDURE TRAY) ×1 IMPLANT
KIT TURNOVER KIT A (KITS) IMPLANT
NDL HYPO 22X1.5 SAFETY MO (MISCELLANEOUS) ×1 IMPLANT
NEEDLE HYPO 22X1.5 SAFETY MO (MISCELLANEOUS) ×1
NS IRRIG 1000ML POUR BTL (IV SOLUTION) ×1 IMPLANT
PACK UNIVERSAL I (CUSTOM PROCEDURE TRAY) IMPLANT
PIN DRILL HDLS TROCAR 75 4PK (PIN) IMPLANT
RELOAD SUT SNGL STCH BLK 2-0 (ENDOMECHANICALS) IMPLANT
SCISSORS LAP 5X45 EPIX DISP (ENDOMECHANICALS) ×1 IMPLANT
SET TUBE SMOKE EVAC HIGH FLOW (TUBING) ×1 IMPLANT
SHEARS HARMONIC 45 ACE (MISCELLANEOUS) ×1 IMPLANT
SLEEVE ADV FIXATION 12X100MM (TROCAR) IMPLANT
SLEEVE ADV FIXATION 5X100MM (TROCAR) ×2 IMPLANT
SOLUTION ANTFG W/FOAM PAD STRL (MISCELLANEOUS) ×1 IMPLANT
SPIKE FLUID TRANSFER (MISCELLANEOUS) ×1 IMPLANT
STRIP CLOSURE SKIN 1/2X4 (GAUZE/BANDAGES/DRESSINGS) IMPLANT
SUT ETHIBOND 2 0 SH 36X2 (SUTURE) IMPLANT
SUT MNCRL AB 4-0 PS2 18 (SUTURE) ×1 IMPLANT
SUT RELOAD ENDO STITCH 2.0 (ENDOMECHANICALS) ×1
SUT SILK 2 0 SH (SUTURE) IMPLANT
SUT SILK 3 0 SH 30 (SUTURE) IMPLANT
SUT SURGIDAC NAB ES-9 0 48 120 (SUTURE) ×1 IMPLANT
SUT VICRYL 0 UR6 27IN ABS (SUTURE) ×1 IMPLANT
SYR 20ML ECCENTRIC (SYRINGE) ×1 IMPLANT
SYR 20ML LL LF (SYRINGE) ×1 IMPLANT
TIP INNERVISION DETACH 40FR (MISCELLANEOUS) IMPLANT
TIP INNERVISION DETACH 50FR (MISCELLANEOUS) IMPLANT
TIP INNERVISION DETACH 56FR (MISCELLANEOUS) IMPLANT
TOWEL OR 17X26 10 PK STRL BLUE (TOWEL DISPOSABLE) ×1 IMPLANT
TOWEL OR NON WOVEN STRL DISP B (DISPOSABLE) ×1 IMPLANT
TRAY FOLEY MTR SLVR 16FR STAT (SET/KITS/TRAYS/PACK) ×1 IMPLANT
TROCAR ADV FIXATION 12X100MM (TROCAR) ×1 IMPLANT
TROCAR ADV FIXATION 5X100MM (TROCAR) ×1 IMPLANT
TROCAR XCEL NON-BLD 5MMX100MML (ENDOMECHANICALS) ×1 IMPLANT

## 2023-02-10 NOTE — Plan of Care (Signed)

## 2023-02-10 NOTE — H&P (Signed)
CC: here for surgery  Requesting provider: Dr Barron Alvine  HPI: Chabeli Zeolla is an 42 y.o. female who is here for laparoscopic hiatal hernia repair with nissen fundoplication, upper endoscopy.  She denies any changes since I saw in the clinic.  She is no longer smoking marijuana.   Old hpi: Shawnita Hunt is a 42 y.o. female who is seen today as an office consultation at the request of Dr. Barron Alvine for evaluation of New Consultation (Discussion of combined HH repair/ ) .   GERD history: -Index symptoms: pyrosis, regurgitation, cough, globus sensation, dyspepsia -Exacerbating features: Supine, forward flexion regurgitation. Tomato-based sauces -Medications trialed: Pantoprazole, omeprazole, Carafate -Current medications: Omeprazole 40 mg twice daily, carafate prn. Rare Tums or Mylanta for breakthrough -Complications: hiatal hernia  SHe comes with her significant other. She states that she has had acid reflux for years. For a long time her medicine worked but over the past year to medicine has not been working as well and she has had breakthrough symptoms. She states that she has acid burning her stomach at times especially if she bends forward. She is feeling full. Since being diagnosed with with the hiatal hernia she feels that she has something pushing up in her epigastric area. Sometimes she chokes on water and liquids and solids. She describes it as a hung up sensation. Rare vomiting. No pain with swallowing. No prior abdominal surgery. She takes omeprazole and Carafate. She states that the omeprazole does work. If she does not take it she will have worsening acid in her throat will feel bad. The Carafate helps soothe sore throat when she has a bad acid attack. She has 2-3 bowel movements per day. She does drink 2 to 3 glasses of wine per week. She smokes marijuana daily. No prior history of blood clots.   Past Medical History:  Diagnosis Date   Allergy    Anxiety    COVID-19  04/27/2021   Depression    Fibroids, subserous    GERD (gastroesophageal reflux disease)    History of ovarian cyst    Hypertension    IDA (iron deficiency anemia)    Microcytic anemia    Mild persistent asthma    Wears glasses     Past Surgical History:  Procedure Laterality Date   COLONOSCOPY  08/20/2020   @ [facility]   ESOPHAGOGASTRODUODENOSCOPY  01/07/2020   @ [facility]   HYSTEROSCOPY N/A 10/13/2020   Procedure: HYSTEROSCOPY, DILATION AND CURETTAGE;  Surgeon: Jerene Bears, MD;  Location: [facility] [place];  Service: Gynecology;  Laterality: N/A;    Family History  Problem Relation Age of Onset   Hypertension Mother    Alcohol abuse Father        now in remission   Lupus Maternal Aunt    Heart disease Maternal Aunt    Depression Maternal Aunt    Colon cancer Maternal Great-grandmother    Breast cancer Neg Hx    Rectal cancer Neg Hx    Stomach cancer Neg Hx    Esophageal cancer Neg Hx     Social:  reports that she has quit smoking. Her smoking use included cigars. She has never used smokeless tobacco. She reports current alcohol use of about 4.0 standard drinks of alcohol per week. She reports current drug use. Frequency: 2.00 times per week. Drug: Marijuana.  Allergies: No Known Allergies  Medications: I have reviewed the patient's current medications.   ROS - all of the below systems have been  reviewed with the patient and positives are indicated with bold text General: chills, fever or night sweats Eyes: blurry vision or double vision ENT: epistaxis or sore throat Allergy/Immunology: itchy/watery eyes or nasal congestion Hematologic/Lymphatic: bleeding problems, blood clots or swollen lymph nodes Endocrine: temperature intolerance or unexpected weight changes Breast: new or changing breast lumps or nipple discharge Resp: cough, shortness of breath, or wheezing CV: chest pain or dyspnea on exertion GI: as per HPI GU: dysuria,  trouble voiding, or hematuria MSK: joint pain or joint stiffness Neuro: TIA or stroke symptoms Derm: pruritus and skin lesion changes Psych: anxiety and depression  PE Blood pressure (!) 165/95, pulse 82, temperature 98.4 F (36.9 C), temperature source Oral, resp. rate 16, height 5\' 2"  (1.575 m), weight 64 kg, last menstrual period 01/21/2023, SpO2 99%. Constitutional: NAD; conversant; no deformities Eyes: Moist conjunctiva; no lid lag; anicteric; PERRL Neck: Trachea midline; no thyromegaly Lungs: Normal respiratory effort; no tactile fremitus CV: RRR; no palpable thrills; no pitting edema GI: Abd soft, nt, nd; no palpable hepatosplenomegaly MSK: Normal gait; no clubbing/cyanosis Psychiatric: Appropriate affect; alert and oriented x3 Lymphatic: No palpable cervical or axillary lymphadenopathy Skin:no rash  Results for orders placed or performed during the hospital encounter of 02/10/23 (from the past 48 hours)  Pregnancy, urine POC     Status: None   Collection Time: 02/10/23  8:55 AM  Result Value Ref Range   Preg Test, Ur NEGATIVE NEGATIVE    Comment:        THE SENSITIVITY OF THIS METHODOLOGY IS >24 mIU/mL     No results found.  Imaging: GERD evaluation: -Last EGD: 08/2022 -Barium esophagram: 11/23/2021: Normal -Esophageal Manometry: -pH/Impedance: None -Bravo: 08/2022: (off PPI): Significant reflux with DeMeester score 24.4, pH <4 in 7.2% of the time  Endoscopic History: - EGD 01/07/20: Dr. Octaviano Glow Normal esophagus, biopsied (no EOE). Empiric dilation to 17mm performed. Normal stomach. Duodenum normal. Biopsies taken. - EGD 10/18/2021: Hill grade 3 valve, normal esophagus. Empiric esophageal dilation with 17 mm Savary with mucosal disruption at UES. Normal stomach. 3 mm benign duodenal polyp, otherwise normal duodenum - EGD 09/28/2022: 1 cm hiatal hernia, Hill grade 3 valve, esophageal dilation with 17 mm Savary with appropriate mucosal disruption at UES consistent with  subtle stricture. Normal stomach and duodenum. Bravo placed in distal esophagus  Labs 06/03/22 - cbc, cmet, tsh   A/P: Addysen Barmore is an 42 y.o. female with  Small sliding hiatal hernia GERD   To OR for upper scopic hiatal hernia repair with Nissen fundoplication and upper endoscopy IV antibiotic Enhanced recovery protocols We rediscussed the typical hospitalization the typical recovery.  We discussed the typical course that we see over the next 3 months I discussed the steps of the surgery again.  I discussed some of the risk and potential complications We discussed the need to change eating techniques after surgery.  We rediscussed the diet progression.  I discussed tips and tricks to minimize postoperative issues We rediscussed the diet progression over the next 6 to 8 weeks All her questions were asked and answered   Mary Sella. Andrey Campanile, MD, FACS General, Bariatric, & Minimally Invasive Surgery [facility]

## 2023-02-10 NOTE — Op Note (Signed)
02/10/2023  2:07 PM  PATIENT:  Erika Frazier  42 y.o. female  PRE-OPERATIVE DIAGNOSIS:  SLIDING HIATAL HERNIA/GERD  POST-OPERATIVE DIAGNOSIS:  SLIDING HIATAL HERNIA/GERD  PROCEDURE:  Procedure(s): LAPAROSCOPIC REPAIR OF SLIDING HIATAL HERNIA REPAIR WITH A NISSEN FUNDOPLICATION; upper endoscopy  SURGEON:  Surgeon(s):  Gaynelle Adu, MD  ASSISTANTS:  Kinsinger, De Blanch, MD  Hillery Hunter Lucilla Edin, MD   ANESTHESIA:   general  DRAINS: none   LOCAL MEDICATIONS USED:  MARCAINE     SPECIMEN:  No Specimen  DISPOSITION OF SPECIMEN:  N/A  COUNTS:  YES  INDICATION FOR PROCEDURE: 42 year old female with a history of heartburn in the setting of a small sliding hiatal hernia was referred by GI for consideration for laparoscopic hiatal hernia repair with Nissen fundoplication.  Symptoms include regurgitation, cough, globus sensation, dyspepsia, pyrosis.  She reports fairly good control on omeprazole twice a day and Carafate as needed but does have some breakthrough symptoms.  She desired surgical intervention.  Risk and benefits along with outcomes were separately discussed with the patient.  PROCEDURE: After obtaining informed consent the patient was taken to the OR 5 at [facility] and placed supine on the operating room table.  General endotracheal anesthesia was established.  Sequential compression devices were placed.  Arms were tucked at her side with the appropriate padding.  She received oral meds prior to surgery including Tylenol and antiemetics.  She also received 5000 units of subcutaneous heparin in addition with Decadron.  Abdomen was prepped and draped in the usual standard surgical fashion.  Antibiotic was administered as well.  Surgical timeout was performed.  Access to the abdomen was obtained at Palmer's point using the Optiview technique.  A small incision was made just below the left subcostal margin in the midclavicular line.  Then using a 0 degree 5 mm  laparoscope through a 5 mm trocar the trocar was smoothly advanced through all layers of the abdominal wall into the abdominal cavity.  Pneumoperitoneum was smoothly established up to a patient pressure of 15 mmHg.  The laparoscope was advanced and abdominal cavity was surveilled.  No evidence of injury to surrounding structures.  Patient was placed in reverse Trendelenburg.  Additional trocars were then placed.  We placed a 5 mm trocar in the right lateral abdominal wall, a 12 mm trocar in the right mid abdomen, a camera 5 mm port just above into the left of the umbilicus and an assistant trocar in the left lateral upper abdominal wall.  A Nathanson liver retractor was placed through a small subxiphoid incision.  A bilateral laparoscopic tap block was performed for postoperative pain relief.  Patient had a small sliding hiatal hernia when visualizing the diaphragm.  The gastrohepatic ligament was opened with harmonic scalpel at the pars flaccida.  It was divided superiorly.  There was no evidence of an aberrant left hepatic artery.  The right phrenoesophageal membrane was incised to expose the right crural fibers underneath it.  A retroesophageal window was made with accommodation of blunt dissection along with harmonic scalpel.  We then continued our dissection anteriorly and exposed to freeing her gastric ligament around the gastroesophageal fat pad.  We then came along the greater curvature the stomach and traction was placed on the stomach anteriorly to expose the short gastric vessels.  The assistant grasped the lateral gastrosplenic ligament providing lateral traction.  We then used harmonic scalpel to divide the short gastrics staying slightly lateral to the stomach wall.  This was continued superiorly  up to the angle of Hiss allowing visualization of the left crus.  We then joined the dissection plane from anteriorly and from the left.   A retroesophageal window is made avoiding the vagus nerves.  A  Penrose drain is placed around the esophagus at the level of the hiatus and used to facilitate caudal retraction.  The distal esophagus was mobilized in the posterior mediastinum and then anteriorly/posteriorly in the intra-abdominal cavity with blunt dissection with a Prestige grasper and take down avascular attachments with the harmonic scalpel taking care to avoid injury to the vagus nerves and the pleura.  We had achieved 4 cm of intra-abdominal esophageal length without any traction.  We then reapproximated the left and right crura posteriorly with 3 interrupted 0 Ethibond sutures with the Endo Stitch each secured with a titanium tie knot.  We then had anesthesia passed a 56 French lighted tip bougie they initially had some trouble passing it about 10 cm down.  We had them pull it out and try 67 [place] which easily came down.  We then took that out and replace it with a 41 [place] which came down easily we saw the bougie go down into the stomach.  I then grasped the greater curvature of the stomach at the posterior fundus and brought it behind the esophagus from left to right.  The anterior fundus was grasped and I was able to perform a shoeshine maneuver.  Thus confirming that we had obtained the correct orientation and location of the upper stomach for the Nissen fundoplication.  Then placed 3 interrupted seromuscular sutures from the left to right first in the anterior fundus and then a seromuscular bite of the esophagus and then the posterior fundus.  Each of the sutures was a 2-0 silk suture.  The length of the fundoplication was approximately 2-1/2 to 3 cm.  It was not terribly tight.  The bougie was removed.  My assistant scrubbed out and performed an upper endoscopy.  The endoscope was placed in the patient's oropharynx and guided down.  There is a little bit irritation to the proximal esophagus but no sign of esophageal injury.  The mid and distal esophagus appeared normal.  The scope was advanced  through the wrap into the stomach.  The stomach was insufflated and then scope was retroflexed.  There is a good 360 degree wrap.  The scope was detorsed and the stomach was desufflated and the endoscope was removed.  Nathanson liver retractor was removed.  The 12 mm trocar fascial defect was reapproximated with 0 Vicryl using PMI suture passer with laparoscopic guidance.  Trocars were removed and skin incisions were closed with a 4 Monocryl in a subcuticular fashion followed by the application of Dermabond.  All needle, instrument, and sponge counts were correct x 2.  There were no immediate complications.  The patient tolerated procedure well.  PLAN OF CARE: Admit for overnight observation  PATIENT DISPOSITION:  PACU - hemodynamically stable.   Delay start of Pharmacological VTE agent (>24hrs) due to surgical blood loss or risk of bleeding:  no  Mary Sella. Andrey Campanile, MD, FACS General, Bariatric, & Minimally Invasive Surgery [facility], [place]

## 2023-02-10 NOTE — Discharge Instructions (Signed)
EATING AFTER YOUR ESOPHAGEAL SURGERY (Stomach Fundoplication, Hiatal Hernia repair, Achalasia surgery, etc)  ######################################################################  EAT Start with a full liquid diet (see below) Gradually transition to a high fiber diet with a fiber supplement over the next month after discharge.    WALK Walk an hour a day.  Control your pain to do that.    CONTROL PAIN Control pain so that you can walk, sleep, tolerate sneezing/coughing, go up/down stairs.  HAVE A BOWEL MOVEMENT DAILY Keep your bowels regular to avoid problems.  OK to try a laxative to override constipation.  OK to use an antidairrheal to slow down diarrhea.  Call if not better after 2 tries  CALL IF YOU HAVE PROBLEMS/CONCERNS Call if you are still struggling despite following these instructions. Call if you have concerns not answered by these instructions  ######################################################################   After your esophageal surgery, expect some sticking with swallowing over the next 1-2 months.    If food sticks when you eat, it is called "dysphagia".  This is due to swelling around your esophagus at the wrap & hiatal diaphragm repair.  It will gradually ease off over the next few months.  To help you through this temporary phase, we start you out on a full liquid diet.  Your first meal in the hospital was thin liquids.  You should have been given a full liquid diet by the time you left the hospital. Stay on clears and full liquids for the first week. Some patients may need to stay on a liquid diet for up to 2 weeks if having trouble swallowing.  Once tolerating that well, you can advance to pureed diet.   We ask patients to stay on a pureed diet for the 2nd-3rd week to avoid anything getting "stuck" near your recent surgery.  Don't be alarmed if your ability to swallow doesn't progress according to this plan.  Everyone is different and some diets can advance  more or less quickly.    It is often helpful to crush your medications or split them as they can sometimes stick, especially the first week or so.   Some BASIC RULES to follow are: Maintain an upright position whenever eating or drinking. Take small bites - just a teaspoon size bite at a time. Eat slowly.  It may also help to eat only one food at a time. Consider nibbling through smaller, more frequent meals & avoid the urge to eat BIG meals Do not push through feelings of fullness, nausea, or bloatedness Try room temperature or warm liquids. Sometimes, cold liquids can make esophagus spasm Can try alkaline water if regular water doesn't taste right or feels heavy Do not mix solid foods and liquids in the same mouthful Try not to "wash foods down" with large gulps of liquids. Avoid carbonated (bubbly/fizzy) drinks.   Avoid foods that make you feel gassy or bloated.  Start with bland foods first.  Wait on trying greasy, fried, or spicy meals until you are tolerating more bland solids well. Understand that it will be hard to burp and belch at first.  This gradually improves with time.  Expect to be more gassy/flatulent/bloated initially.  Walking will help your body manage it better. Consider using medications for bloating that contain simethicone such as  Maalox or Gas-X  Consider crushing your medications, especially smaller pills.  The ability to swallow pills should get easier after a few weeks Do not multiple pills at the same time Eat in a relaxed atmosphere & minimize  distractions. Avoid talking while eating.   Do not use straws. Following each meal, sit in an upright position (90 degree angle) for 60 to 90 minutes.  Going for a short walk can help as well If food does stick, don't panic.  Try to relax and let the food pass on its own.  Sipping WARM LIQUID such as strong hot black tea can also help slide it down. Eating techniques 20-20-20 (30-30-30) 20 chews, 20 seconds between  bites of food, 20 minutes to eat; sometimes you may need 30 chews, 30 seconds etc Use your nondominant hand to eat with Put fork down between bites of food Use a timer after swallowing to reinforce waiting 20-30 sec between bites of food Use a child/infant size utensil Try not to eat while watching TV    Be gradual in changes & use common sense:  -If you easily tolerating a certain "level" of foods, advance to the next level gradually -If you are having trouble swallowing a particular food, then avoid it.   -If food is sticking when you advance your diet, go back to thinner previous diet (the lower LEVEL) for 1-2 days.  LEVEL 2 = PUREED DIET  Start 1.5- 2 WEEKS AFTER SURGERY IF YOU ARE TOLERATING A FULL LIQUID DIET EASILY  -Foods in this group are pureed or blenderized to a smooth, mashed potato-like consistency.  -If necessary, the pureed foods can keep their shape with the addition of a thickening agent.   -Meat should be pureed to a smooth, pasty consistency.  Hot broth or gravy may be added to the pureed meat, approximately 1 oz. of liquid per 3 oz. serving of meat. -CAUTION:  If any foods do not puree into a smooth consistency, swallowing will be more difficult.  (For example, nuts or seeds sometimes do not blend well.)  Hot Foods Cold Foods  Pureed scrambled eggs and cheese Pureed cottage cheese  Baby cereals Thickened juices and nectars  Thinned cooked cereals (no lumps) Thickened milk or eggnog  Pureed [place] toast or pancakes Ensure  Mashed potatoes Ice cream  Pureed parsley, au gratin, scalloped potatoes, candied sweet potatoes Fruit or [place] ice, sherbet  Pureed buttered or alfredo noodles Plain yogurt  Pureed vegetables (no corn or peas) Instant breakfast  Pureed soups and creamed soups Smooth pudding, mousse, custard  Pureed scalloped apples Whipped gelatin  Gravies Sugar, syrup, honey, jelly  Sauces, cheese, tomato, barbecue, white, creamed Cream  Any baby food  Creamer  Alcohol in moderation (not beer or champagne) Margarine  Coffee or tea Mayonnaise   Ketchup, mustard   Apple sauce   SAMPLE MENU:  PUREED DIET Breakfast Lunch Dinner  Orange juice, 1/2 cup Cream of wheat, 1/2 cup Pineapple juice, 1/2 cup Pureed [place], barley soup, 3/4 cup Pureed Hawaiian chicken, 3 oz  Scrambled eggs, mashed or blended with cheese, 1/2 cup Tea or coffee, 1 cup  Whole milk, 1 cup  Non-dairy creamer, 2 Tbsp. Mashed potatoes, 1/2 cup Pureed cooled broccoli, 1/2 cup Apple sauce, 1/2 cup Coffee or tea Mashed potatoes, 1/2 cup Pureed spinach, 1/2 cup Frozen yogurt, 1/2 cup Tea or coffee      LEVEL 3 = SOFT DIET  After your first 4-5 weeks, you can advance to a soft diet.   Keep on this diet until everything goes down easily.  Hot Foods Cold Foods  White fish Cottage cheese  Stuffed fish Junior baby fruit  Baby food meals Semi thickened juices  Minced soft cooked,  scrambled, poached eggs nectars  Souffle & omelets Ripe mashed bananas  Cooked cereals Canned fruit, pineapple sauce, milk  potatoes Milkshake  Buttered or Alfredo noodles Custard  Cooked cooled vegetable Puddings, including tapioca  Sherbet Yogurt  Vegetable soup or alphabet soup Fruit ice, [place] ice  Gravies Whipped gelatin  Sugar, syrup, honey, jelly Junior baby desserts  Sauces:  Cheese, creamed, barbecue, tomato, white Cream  Coffee or tea Margarine   SAMPLE MENU:  LEVEL 3 Breakfast Lunch Dinner  Orange juice, 1/2 cup Oatmeal, 1/2 cup Scrambled eggs with cheese, 1/2 cup Decaffeinated tea, 1 cup Whole milk, 1 cup Non-dairy creamer, 2 Tbsp Pineapple juice, 1/2 cup Minced beef, 3 oz Gravy, 2 Tbsp Mashed potatoes, 1/2 cup Minced fresh broccoli, 1/2 cup Applesauce, 1/2 cup Coffee, 1 cup [place], barley soup, 3/4 cup Minced Hawaiian chicken, 3 oz Mashed potatoes, 1/2 cup Cooked spinach, 1/2 cup Frozen yogurt, 1/2 cup Non-dairy creamer, 2 Tbsp      LEVEL 4 = CHOPPED  DIET  -After all the foods in level 3 (soft diet) are passing through well you should advance up to more chopped foods.  -It is still important to cut these foods into small pieces and eat slowly.  Hot Foods Cold Foods  Poultry Cottage cheese  Chopped Swedish meatballs Yogurt  Meat salads (ground or flaked meat) Milk  Flaked fish (tuna) Milkshakes  Poached or scrambled eggs Soft, cold, dry cereal  Souffles and omelets Fruit juices or nectars  Cooked cereals Chopped canned fruit  Chopped [place] toast or pancakes Canned fruit cocktail  Noodles or pasta (no rice) Pudding, mousse, custard  Cooked vegetables (no frozen peas, corn, or mixed vegetables) Green salad  Canned small sweet peas Ice cream  Creamed soup or vegetable soup Fruit ice, [place] ice  Pureed vegetable soup or alphabet soup Non-dairy creamer  Ground scalloped apples Margarine  Gravies Mayonnaise  Sauces:  Cheese, creamed, barbecue, tomato, white Ketchup  Coffee or tea Mustard   SAMPLE MENU:  LEVEL 4 Breakfast Lunch Dinner  Orange juice, 1/2 cup Oatmeal, 1/2 cup Scrambled eggs with cheese, 1/2 cup Decaffeinated tea, 1 cup Whole milk, 1 cup Non-dairy creamer, 2 Tbsp Ketchup, 1 Tbsp Margarine, 1 tsp Salt, 1/4 tsp Sugar, 2 tsp Pineapple juice, 1/2 cup Ground beef, 3 oz Gravy, 2 Tbsp Mashed potatoes, 1/2 cup Cooked spinach, 1/2 cup Applesauce, 1/2 cup Decaffeinated coffee Whole milk Non-dairy creamer, 2 Tbsp Margarine, 1 tsp Salt, 1/4 tsp Pureed [place], barley soup, 3/4 cup Barbecue chicken, 3 oz Mashed potatoes, 1/2 cup Ground fresh broccoli, 1/2 cup Frozen yogurt, 1/2 cup Decaffeinated tea, 1 cup Non-dairy creamer, 2 Tbsp Margarine, 1 tsp Salt, 1/4 tsp Sugar, 1 tsp    LEVEL 5:  REGULAR FOODS  -Foods in this group are soft, moist, regularly textured foods.   -This level includes meat and breads, which tend to be the hardest things to swallow.   -Eat very slowly, chew well and continue to avoid  carbonated drinks. -most people are at this level in 6 weeks  Hot Foods Cold Foods  Baked fish or skinned Soft cheeses - cottage cheese  Souffles and omelets Cream cheese  Eggs Yogurt  Stuffed shells Milk  Spaghetti with meat sauce Milkshakes  Cooked cereal Cold dry cereals (no nuts, dried fruit, coconut)  [place] toast or pancakes Crackers  Buttered toast Fruit juices or nectars  Noodles or pasta (no rice) Canned fruit  Potatoes (all types) Ripe bananas  Soft, cooked vegetables (no corn, lima,  or baked beans) Peeled, ripe, fresh fruit  Creamed soups or vegetable soup Cakes (no nuts, dried fruit, coconut)  Canned chicken noodle soup Plain doughnuts  Gravies Ice cream  Bacon dressing Pudding, mousse, custard  Sauces:  Cheese, creamed, barbecue, tomato, white Fruit ice, [place] ice, sherbet  Decaffeinated tea or coffee Whipped gelatin  Pork chops Regular gelatin   Canned fruited gelatin molds   Sugar, syrup, honey, jam, jelly   Cream   Non-dairy   Margarine   Oil   Mayonnaise   Ketchup   Mustard   TROUBLESHOOTING IRREGULAR BOWELS  1) Avoid extremes of bowel movements (no bad constipation/diarrhea)  2) Miralax 17gm mixed in 8oz. water or juice-daily. May use BID as needed.  3) Gas-x,Phazyme, etc. as needed for gas & bloating.  4) Soft,bland diet. No spicy,greasy,fried foods.  5) Prilosec over-the-counter as needed  6) May hold gluten/wheat products from diet to see if symptoms improve.  7) May try probiotics (Align, Activa, etc) to help calm the bowels down  7) If symptoms become worse call back immediately.    If you have any questions please call our office at [facility]: [phone].

## 2023-02-10 NOTE — Anesthesia Procedure Notes (Signed)
Procedure Name: Intubation Date/Time: 02/10/2023 12:08 PM  Performed by: Theodosia Quay, CRNAPre-anesthesia Checklist: Patient identified, Emergency Drugs available, Suction available, Patient being monitored and Timeout performed Patient Re-evaluated:Patient Re-evaluated prior to induction Oxygen Delivery Method: Circle system utilized Preoxygenation: Pre-oxygenation with 100% oxygen Induction Type: IV induction Ventilation: Mask ventilation without difficulty Laryngoscope Size: Mac and 3 Grade View: Grade I Tube type: Oral Tube size: 7.0 mm Number of attempts: 1 Airway Equipment and Method: Stylet Placement Confirmation: ETT inserted through vocal cords under direct vision, positive ETCO2, CO2 detector and breath sounds checked- equal and bilateral Secured at: 22 cm Tube secured with: Tape Dental Injury: Teeth and Oropharynx as per pre-operative assessment  Comments: ATOI

## 2023-02-10 NOTE — Transfer of Care (Signed)
Immediate Anesthesia Transfer of Care Note  Patient: Erika Frazier  Procedure(s) Performed: Procedure(s): LAPAROSCOPIC REPAIR OF SLIDING HIATAL HERNIA REPAIR WITH A NISSEN FUNDOPLICATION (N/A)  Patient Location: PACU  Anesthesia Type:General  Level of Consciousness:  sedated, patient cooperative and responds to stimulation  Airway & Oxygen Therapy:Patient Spontanous Breathing and Patient connected to face mask oxgen  Post-op Assessment:  Report given to PACU RN and Post -op Vital signs reviewed and stable  Post vital signs:  Reviewed and stable  Last Vitals:  Vitals:   02/10/23 0905  BP: (!) 165/95  Pulse: 82  Resp: 16  Temp: 36.9 C  SpO2: 99%    Complications: No apparent anesthesia complications

## 2023-02-10 NOTE — Anesthesia Postprocedure Evaluation (Signed)
Anesthesia Post Note  Patient: Erika Frazier  Procedure(s) Performed: LAPAROSCOPIC REPAIR OF SLIDING HIATAL HERNIA REPAIR WITH A NISSEN FUNDOPLICATION     Patient location during evaluation: PACU Anesthesia Type: General Level of consciousness: awake and alert Pain management: pain level controlled Vital Signs Assessment: post-procedure vital signs reviewed and stable Respiratory status: spontaneous breathing, nonlabored ventilation, respiratory function stable and patient connected to nasal cannula oxygen Cardiovascular status: blood pressure returned to baseline and stable Postop Assessment: no apparent nausea or vomiting Anesthetic complications: no   No notable events documented.  Last Vitals:  Vitals:   02/10/23 1545 02/10/23 1600  BP: (!) 146/102 (!) 147/92  Pulse: 89 84  Resp: 17   Temp:  37.1 C  SpO2: 100% 100%    Last Pain:  Vitals:   02/10/23 1600  TempSrc: Oral  PainSc: 9                  Trevor Iha

## 2023-02-11 ENCOUNTER — Encounter (HOSPITAL_COMMUNITY): Payer: Self-pay | Admitting: General Surgery

## 2023-02-11 ENCOUNTER — Ambulatory Visit (HOSPITAL_COMMUNITY): Payer: BC Managed Care – PPO

## 2023-02-11 DIAGNOSIS — F1290 Cannabis use, unspecified, uncomplicated: Secondary | ICD-10-CM | POA: Diagnosis not present

## 2023-02-11 DIAGNOSIS — I10 Essential (primary) hypertension: Secondary | ICD-10-CM | POA: Diagnosis not present

## 2023-02-11 DIAGNOSIS — Z87891 Personal history of nicotine dependence: Secondary | ICD-10-CM | POA: Diagnosis not present

## 2023-02-11 DIAGNOSIS — K449 Diaphragmatic hernia without obstruction or gangrene: Secondary | ICD-10-CM | POA: Diagnosis not present

## 2023-02-11 DIAGNOSIS — J4530 Mild persistent asthma, uncomplicated: Secondary | ICD-10-CM | POA: Diagnosis not present

## 2023-02-11 DIAGNOSIS — K219 Gastro-esophageal reflux disease without esophagitis: Secondary | ICD-10-CM | POA: Diagnosis not present

## 2023-02-11 DIAGNOSIS — Z9889 Other specified postprocedural states: Secondary | ICD-10-CM | POA: Diagnosis not present

## 2023-02-11 LAB — BASIC METABOLIC PANEL
Anion gap: 8 (ref 5–15)
BUN: 9 mg/dL (ref 6–20)
CO2: 25 mmol/L (ref 22–32)
Calcium: 8.7 mg/dL — ABNORMAL LOW (ref 8.9–10.3)
Chloride: 101 mmol/L (ref 98–111)
Creatinine, Ser: 0.72 mg/dL (ref 0.44–1.00)
GFR, Estimated: >60 mL/min (ref >60)
Glucose, Bld: 119 mg/dL — ABNORMAL HIGH (ref 70–99)
Potassium: 3.6 mmol/L (ref 3.5–5.1)
Sodium: 134 mmol/L — ABNORMAL LOW (ref 135–145)

## 2023-02-11 LAB — CBC
HCT: 34.5 % — ABNORMAL LOW (ref 36.0–46.0)
Hemoglobin: 10.8 g/dL — ABNORMAL LOW (ref 12.0–15.0)
MCH: 24 pg — ABNORMAL LOW (ref 26.0–34.0)
MCHC: 31.3 g/dL (ref 30.0–36.0)
MCV: 76.7 fL — ABNORMAL LOW (ref 80.0–100.0)
Platelets: 330 10*3/uL (ref 150–400)
RBC: 4.5 MIL/uL (ref 3.87–5.11)
RDW: 14.2 % (ref 11.5–15.5)
WBC: 9.9 10*3/uL (ref 4.0–10.5)
nRBC: 0 % (ref 0.0–0.2)

## 2023-02-11 MED ORDER — IOHEXOL 300 MG/ML  SOLN
50.0000 mL | Freq: Once | INTRAMUSCULAR | Status: AC | PRN
  Administered 2023-02-11: 50 mL via ORAL

## 2023-02-11 NOTE — TOC Initial Note (Signed)
Transition of Care [facility]) - Initial/Assessment Note    Patient Details  Name: Erika Frazier MRN: [id number] Date of Birth: 13-Dec-1980  Transition of Care [facility]) CM/SW Contact:    Adrian Prows, RN Phone Number: 02/11/2023, 1:54 PM  Clinical Narrative:                 Sherron Monday w/ pt in room; she identified POC Claudius Sis [phone]); no TOC needs identified; TOC is signing off.  Expected Discharge Plan: Home/Self Care Barriers to Discharge: No Barriers Identified   Patient Goals and CMS Choice Patient states their goals for this hospitalization and ongoing recovery are:: home          Expected Discharge Plan and Services   Discharge Planning Services: CM Consult Post Acute Care Choice: NA Living arrangements for the past 2 months: Single Family Home Expected Discharge Date: 02/11/23               DME Arranged: N/A DME Agency: NA       HH Arranged: NA HH Agency: NA        Prior Living Arrangements/Services Living arrangements for the past 2 months: Single Family Home Lives with:: Significant Other Patient language and need for interpreter reviewed:: Yes Do you feel safe going back to the place where you live?: Yes      Need for Family Participation in Patient Care: Yes (Comment) Care giver support system in place?: Yes (comment) Current home services:  (n/a) Criminal Activity/Legal Involvement Pertinent to Current Situation/Hospitalization: No - Comment as needed  Activities of Daily Living   ADL Screening (condition at time of admission) Independently performs ADLs?: Yes (appropriate for developmental age) Is the patient deaf or have difficulty hearing?: No Does the patient have difficulty seeing, even when wearing glasses/contacts?: No Does the patient have difficulty concentrating, remembering, or making decisions?: No  Permission Sought/Granted Permission sought to share information with : Case Manager Permission granted to share information  with : Yes, Verbal Permission Granted  Share Information with NAME: Case Manager     Permission granted to share info w Relationship: Claudius Sis (S.O.) [phone]     Emotional Assessment Appearance:: Appears stated age Attitude/Demeanor/Rapport: Gracious Affect (typically observed): Accepting Orientation: : Oriented to Self, Oriented to Place, Oriented to  Time, Oriented to Situation Alcohol / Substance Use: Not Applicable Psych Involvement: No (comment)  Admission diagnosis:  S/P Nissen fundoplication (without gastrostomy tube) procedure [Z98.890] Patient Active Problem List   Diagnosis Date Noted   S/P Nissen fundoplication (without gastrostomy tube) procedure 02/10/2023   Intramural uterine fibroid    Menorrhagia with regular cycle    Cervix abnormality    Fibroids, subserous 02/12/2020   Adjustment disorder with depressed mood 10/04/2019   Mild persistent asthma without complication 03/26/2019   Iron deficiency anemia 05/13/2013   PCP:  Jarold Motto, PA Pharmacy:   [facility] [phone] [place] Otto, [place] - [address] AT [facility] [address] [place] [address] Phone: [phone] Fax: [phone]  [facility] - [place], [place] - [address] [place] [address] Phone: [phone] Fax: [phone]     Social Drivers of Health (SDOH) Social History: SDOH Screenings   Food Insecurity: No Food Insecurity (02/11/2023)  Housing: Low Risk  (02/11/2023)  Transportation Needs: No Transportation Needs (02/11/2023)  Utilities: Not At Risk (02/11/2023)  Alcohol Screen: Low Risk  (06/07/2022)  Depression (PHQ2-9): Low  Risk  (11/23/2022)  Financial Resource Strain: Low Risk  (06/07/2022)  Physical Activity: Insufficiently Active (06/07/2022)  Social Connections: Moderately Integrated (06/07/2022)  Stress: Stress Concern Present (06/07/2022)  Tobacco Use: Medium Risk  (02/10/2023)   SDOH Interventions: Food Insecurity Interventions: Intervention Not Indicated, Inpatient TOC Housing Interventions: Intervention Not Indicated, Inpatient TOC Transportation Interventions: Intervention Not Indicated, Inpatient TOC Utilities Interventions: Intervention Not Indicated, Inpatient TOC   Readmission Risk Interventions     No data to display

## 2023-02-11 NOTE — Plan of Care (Signed)

## 2023-02-11 NOTE — Progress Notes (Signed)
AVS reviewed w/ patient who verbalized an understanding- No other questions at this time. PIV removed as noted. Pt dressing for d/c to home. Pt's s/o enroute to the hospital

## 2023-02-11 NOTE — Progress Notes (Signed)
1 Day Post-Op   Subjective/Chief Complaint: No complaints.   Objective: Vital signs in last 24 hours: Temp:  [97.8 F (36.6 C)-98.8 F (37.1 C)] 98.4 F (36.9 C) (12/14 1012) Pulse Rate:  [77-100] 77 (12/14 1012) Resp:  [16-30] 20 (12/14 1012) BP: (124-168)/(90-105) 142/98 (12/14 1012) SpO2:  [97 %-100 %] 98 % (12/14 1012)    Intake/Output from previous day: 12/13 0701 - 12/14 0700 In: 2724.2 [P.O.:960; I.V.:1764.2] Out: 1725 [Urine:1700; Blood:25] Intake/Output this shift: No intake/output data recorded.  General appearance: alert and cooperative Resp: clear to auscultation bilaterally Cardio: regular rate and rhythm GI: Soft, mild tenderness.  Lab Results:  Recent Labs    02/11/23 0539  WBC 9.9  HGB 10.8*  HCT 34.5*  PLT 330   BMET Recent Labs    02/11/23 0539  NA 134*  K 3.6  CL 101  CO2 25  GLUCOSE 119*  BUN 9  CREATININE 0.72  CALCIUM 8.7*   PT/INR No results for input(s): "LABPROT", "INR" in the last 72 hours. ABG No results for input(s): "PHART", "HCO3" in the last 72 hours.  Invalid input(s): "PCO2", "PO2"  Studies/Results: DG ESOPHAGUS W SINGLE CM (SOL OR THIN BA) Result Date: 02/11/2023 CLINICAL DATA:  42 year old female1 day postop from paraesophageal hiatal hernia repair and fundoplication. Evaluate for post op leak. EXAM: WATER SOLUBLE ESOPHAGRAM TECHNIQUE: Single contrast examination was performed using 50 mL of water-soluble contrast. This exam was performed by Lawernce Ion, PA-C, and was supervised and interpreted by Myles Rosenthal, MD. FLUOROSCOPY: Radiation Exposure Index (as provided by the fluoroscopic device): 18.4 mGy Kerma COMPARISON:  None Available. FINDINGS: Scout image shows a normal gas pattern. Mild extrinsic compression is seen on the gastric cardia and GE junction which is attributable to the fundoplication wrap. Contrast passes through the esophagus and into the stomach, without evidence of obstruction. No evidence of contrast  leak or extravasation. No other significant abnormality identified. IMPRESSION: Expected postop appearance status post fundoplication. No evidence of postoperative leak or obstruction. Electronically Signed   By: Danae Orleans M.D.   On: 02/11/2023 11:31    Anti-infectives: Anti-infectives (From admission, onward)    Start     Dose/Rate Route Frequency Ordered Stop   02/10/23 0900  ceFAZolin (ANCEF) IVPB 2g/100 mL premix        2 g 200 mL/hr over 30 Minutes Intravenous On call to O.R. 02/10/23 0856 02/10/23 1212       Assessment/Plan: s/p Procedure(s): LAPAROSCOPIC REPAIR OF SLIDING HIATAL HERNIA REPAIR WITH A NISSEN FUNDOPLICATION (N/A) Advance diet.  Allow fulls Upper GI shows no leak Plan for discharge today  LOS: 0 days    Chevis Pretty III 02/11/2023

## 2023-03-09 ENCOUNTER — Encounter: Payer: Self-pay | Admitting: Gastroenterology

## 2023-03-23 ENCOUNTER — Other Ambulatory Visit: Payer: Self-pay | Admitting: Physician Assistant

## 2023-05-26 ENCOUNTER — Encounter: Payer: Self-pay | Admitting: Physician Assistant

## 2023-05-26 ENCOUNTER — Ambulatory Visit (INDEPENDENT_AMBULATORY_CARE_PROVIDER_SITE_OTHER): Admitting: Physician Assistant

## 2023-05-26 ENCOUNTER — Other Ambulatory Visit (HOSPITAL_COMMUNITY)
Admission: RE | Admit: 2023-05-26 | Discharge: 2023-05-26 | Disposition: A | Discharge status: Home / Self Care | Source: Ambulatory Visit | Attending: Physician Assistant | Admitting: Physician Assistant

## 2023-05-26 VITALS — BP 120/80 | HR 82 | Temp 98.2°F | Ht 62.0 in | Wt 150.0 lb

## 2023-05-26 DIAGNOSIS — Z113 Encounter for screening for infections with a predominantly sexual mode of transmission: Secondary | ICD-10-CM | POA: Insufficient documentation

## 2023-05-26 DIAGNOSIS — D509 Iron deficiency anemia, unspecified: Secondary | ICD-10-CM | POA: Diagnosis not present

## 2023-05-26 DIAGNOSIS — Z0000 Encounter for general adult medical examination without abnormal findings: Secondary | ICD-10-CM | POA: Diagnosis not present

## 2023-05-26 DIAGNOSIS — K2100 Gastro-esophageal reflux disease with esophagitis, without bleeding: Secondary | ICD-10-CM

## 2023-05-26 DIAGNOSIS — J4530 Mild persistent asthma, uncomplicated: Secondary | ICD-10-CM | POA: Diagnosis not present

## 2023-05-26 DIAGNOSIS — I10 Essential (primary) hypertension: Secondary | ICD-10-CM

## 2023-05-26 DIAGNOSIS — Z13220 Encounter for screening for lipoid disorders: Secondary | ICD-10-CM | POA: Diagnosis not present

## 2023-05-26 DIAGNOSIS — E663 Overweight: Secondary | ICD-10-CM

## 2023-05-26 LAB — CBC WITH DIFFERENTIAL/PLATELET
Basophils Absolute: 0 10*3/uL (ref 0.0–0.1)
Basophils Relative: 0.8 % (ref 0.0–3.0)
Eosinophils Absolute: 0.1 10*3/uL (ref 0.0–0.7)
Eosinophils Relative: 2.6 % (ref 0.0–5.0)
HCT: 36.8 % (ref 36.0–46.0)
Hemoglobin: 11.7 g/dL — ABNORMAL LOW (ref 12.0–15.0)
Lymphocytes Relative: 48.3 % — ABNORMAL HIGH (ref 12.0–46.0)
Lymphs Abs: 2.3 10*3/uL (ref 0.7–4.0)
MCHC: 31.7 g/dL (ref 30.0–36.0)
MCV: 75 fl — ABNORMAL LOW (ref 78.0–100.0)
Monocytes Absolute: 0.3 10*3/uL (ref 0.1–1.0)
Monocytes Relative: 6.5 % (ref 3.0–12.0)
Neutro Abs: 2 10*3/uL (ref 1.4–7.7)
Neutrophils Relative %: 41.8 % — ABNORMAL LOW (ref 43.0–77.0)
Platelets: 439 10*3/uL — ABNORMAL HIGH (ref 150.0–400.0)
RBC: 4.92 Mil/uL (ref 3.87–5.11)
RDW: 15 % (ref 11.5–15.5)
WBC: 4.7 10*3/uL (ref 4.0–10.5)

## 2023-05-26 LAB — COMPREHENSIVE METABOLIC PANEL WITH GFR
ALT: 12 U/L (ref 0–35)
AST: 14 U/L (ref 0–37)
Albumin: 4.6 g/dL (ref 3.5–5.2)
Alkaline Phosphatase: 78 U/L (ref 39–117)
BUN: 12 mg/dL (ref 6–23)
CO2: 31 meq/L (ref 19–32)
Calcium: 9.6 mg/dL (ref 8.4–10.5)
Chloride: 102 meq/L (ref 96–112)
Creatinine, Ser: 0.87 mg/dL (ref 0.40–1.20)
GFR: 82.39 mL/min (ref >60.00)
Glucose, Bld: 90 mg/dL (ref 70–99)
Potassium: 4.2 meq/L (ref 3.5–5.1)
Sodium: 140 meq/L (ref 135–145)
Total Bilirubin: 0.6 mg/dL (ref 0.2–1.2)
Total Protein: 7.2 g/dL (ref 6.0–8.3)

## 2023-05-26 LAB — IBC + FERRITIN
Ferritin: 37 ng/mL (ref 10.0–291.0)
Iron: 133 ug/dL (ref 42–145)
Saturation Ratios: 30.3 % (ref 20.0–50.0)
TIBC: 439.6 ug/dL (ref 250.0–450.0)
Transferrin: 314 mg/dL (ref 212.0–360.0)

## 2023-05-26 LAB — LIPID PANEL
Cholesterol: 244 mg/dL — ABNORMAL HIGH (ref 0–200)
HDL: 63.2 mg/dL (ref >39.00)
LDL Cholesterol: 160 mg/dL — ABNORMAL HIGH (ref 0–99)
NonHDL: 180.9
Total CHOL/HDL Ratio: 4
Triglycerides: 106 mg/dL (ref 0.0–149.0)
VLDL: 21.2 mg/dL (ref 0.0–40.0)

## 2023-05-26 MED ORDER — MIRTAZAPINE 15 MG PO TABS: 15.0000 mg | ORAL_TABLET | Freq: Every day | ORAL | 1 refills | Status: DC

## 2023-05-26 MED ORDER — ALBUTEROL SULFATE HFA 108 (90 BASE) MCG/ACT IN AERS
2.0000 | INHALATION_SPRAY | Freq: Four times a day (QID) | RESPIRATORY_TRACT | 3 refills | Status: DC | PRN
Start: 2023-05-26 — End: 2023-07-05

## 2023-05-26 MED ORDER — AZELASTINE HCL 0.1 % NA SOLN: 1.0000 | Freq: Two times a day (BID) | NASAL | 12 refills | Status: AC

## 2023-05-26 NOTE — Patient Instructions (Addendum)
 It was great to see you!  Restart the allegra If no improvement, trial the nasal spray  Please go to the lab for blood work.   Our office will call you with your results unless you have chosen to receive results via MyChart.  If your blood work is normal we will follow-up each year for physicals and as scheduled for chronic medical problems.  If anything is abnormal we will treat accordingly and get you in for a follow-up.  Take care,  Erika Frazier

## 2023-05-26 NOTE — Progress Notes (Signed)
 Subjective:    Erika Frazier is a 43 y.o. female and is here for a comprehensive physical exam.  HPI  Health Maintenance Due  Topic Date Due   Pneumococcal Vaccine 11-66 Years old (2 of 2 - PCV) 06/17/2017    Acute Concerns: None   Chronic Issues: HTN  Currently not on any medications. No concerns at this time.  Patient denies chest pain, SOB, blurred vision, dizziness, unusual headaches, lower leg swelling. Patient is compliant with medication. Denies excessive caffeine intake, stimulant usage, excessive alcohol intake, or increase in salt   GERD Reports compliance and good tolerance of omeprazole 40 mg once daily. Recently underwent a hiatal hernia repair and a nissen fundoplications in December.  She reports tolerating procedure well other than anticipated bowel changes.  She continues to be mindful of er diet and avoids greasy foods.   Insomnia  Reports compliance and good tolerance of Remeron 7.5 mg as needed. She states that she typically takes it to aid in her sleeping.   IDA  Continues to be managed with ferrous sulfate 75 mg liquid. Tolerates this well.  No concerns or symptoms at this time. Will recheck levels today.   Asthma  Currently on albuterol 108 mg and Proventil 2.5 mg. Requesting a refill.   She complains of allergy- like symptoms including itchy ears/throat and nasal congestion.  She was previously on Claritin for years but has not used anything recently. Agreeable to trying out allegra and Flonase.  Also reports using a humidifier and a purifier to help avoid symptoms.    Health Maintenance: Immunizations -- N/A Colonoscopy -- Last done on 08-20-20. Repeat at age 89  Mammogram -- last done on 08-12-22. Normal  PAP -- last done on 03-18-20. Normal  Bone Density -- N/A Diet -- typically follows a healthy, well balanced diet Exercise -- kickboxing regularly   Sleep habits -- occasionally  Mood -- stable   UTD with dentist? - yes UTD with eye  doctor? - yes  Weight history: Wt Readings from Last 10 Encounters:  05/26/23 150 lb (68 kg)  02/10/23 141 lb (64 kg)  01/23/23 141 lb (64 kg)  12/23/22 141 lb 8 oz (64.2 kg)  11/23/22 140 lb 9.6 oz (63.8 kg)  09/28/22 132 lb (59.9 kg)  09/20/22 132 lb (59.9 kg)  08/03/22 129 lb 6.4 oz (58.7 kg)  06/07/22 129 lb 4 oz (58.6 kg)  06/03/22 128 lb (58.1 kg)   Body mass index is 27.44 kg/m. Patient's last menstrual period was 05/25/2023 (exact date).  Alcohol use:  reports current alcohol use of about 1.0 standard drink of alcohol per week.  Tobacco use:  Tobacco Use: Medium Risk (05/26/2023)   Patient History    Smoking Tobacco Use: Former    Smokeless Tobacco Use: Never    Passive Exposure: Not on file   Eligible for lung cancer screening? no     05/26/2023    1:02 PM  Depression screen PHQ 2/9  Decreased Interest 0  Down, Depressed, Hopeless 0  PHQ - 2 Score 0     Other providers/specialists: Patient Care Team: Jarold Motto, [place] as PCP - General (Physician Assistant)    PMHx, SurgHx, SocialHx, Medications, and Allergies were reviewed in the Visit Navigator and updated as appropriate.   Past Medical History:  Diagnosis Date   Allergy    Anxiety    COVID-19 04/27/2021   Depression    Fibroids, subserous    GERD (gastroesophageal reflux disease)  History of ovarian cyst    Hypertension    IDA (iron deficiency anemia)    Microcytic anemia    Mild persistent asthma    Wears glasses      Past Surgical History:  Procedure Laterality Date   COLONOSCOPY  08/20/2020   @ [facility]   ESOPHAGOGASTRODUODENOSCOPY  01/07/2020   @ [facility]   HERNIA REPAIR     HIATAL HERNIA REPAIR N/A 02/10/2023   Procedure: LAPAROSCOPIC REPAIR OF SLIDING HIATAL HERNIA REPAIR WITH A NISSEN FUNDOPLICATION;  Surgeon: Gaynelle Adu, MD;  Location: [facility];  Service: General;  Laterality: N/A;   HYSTEROSCOPY N/A 10/13/2020   Procedure: HYSTEROSCOPY, DILATION  AND CURETTAGE;  Surgeon: Jerene Bears, MD;  Location: [facility];  Service: Gynecology;  Laterality: N/A;     Family History  Problem Relation Age of Onset   Hypertension Mother    Alcohol abuse Father        now in remission   Lupus Maternal Aunt    Heart disease Maternal Aunt    Depression Maternal Aunt    Colon cancer Maternal Great-grandmother    Breast cancer Neg Hx    Rectal cancer Neg Hx    Stomach cancer Neg Hx    Esophageal cancer Neg Hx     Social History   Tobacco Use   Smoking status: Former    Types: Cigars   Smokeless tobacco: Never   Tobacco comments:    Doesn't smoke tobacco  Vaping Use   Vaping status: Never Used  Substance Use Topics   Alcohol use: Yes    Alcohol/week: 1.0 standard drink of alcohol    Types: 1 Glasses of wine per week    Comment: occasional   Drug use: Yes    Frequency: 2.0 times per week    Types: Marijuana    Comment: 10-08-2020  per pt smokes average 3 times per week    Review of Systems:   Review of Systems  Constitutional:  Negative for chills, fever, malaise/fatigue and weight loss.  HENT:  Negative for hearing loss, sinus pain and sore throat.   Respiratory:  Negative for cough and hemoptysis.   Cardiovascular:  Negative for chest pain, palpitations, leg swelling and PND.  Gastrointestinal:  Positive for heartburn. Negative for abdominal pain, constipation, diarrhea, nausea and vomiting.  Genitourinary:  Negative for dysuria, frequency and urgency.  Musculoskeletal:  Negative for back pain, myalgias and neck pain.  Skin:  Negative for itching and rash.  Neurological:  Negative for dizziness, tingling, seizures and headaches.  Endo/Heme/Allergies:  Negative for polydipsia.  Psychiatric/Behavioral:  Negative for depression. The patient is not nervous/anxious.     Objective:   BP 120/80 (BP Location: Left Arm, Patient Position: Sitting, Cuff Size: Large)   Pulse 82   Temp 98.2 F (36.8 C) (Temporal)    Ht 5\' 2"  (1.575 m)   Wt 150 lb (68 kg)   LMP 05/25/2023 (Exact Date)   SpO2 98%   BMI 27.44 kg/m  Body mass index is 27.44 kg/m.   General Appearance:    Alert, cooperative, no distress, appears stated age  Head:    Normocephalic, without obvious abnormality, atraumatic  Eyes:    PERRL, conjunctiva/corneas clear, EOM's intact, fundi    benign, both eyes  Ears:    Normal TM's and external ear canals, both ears  Nose:   Nares normal, septum midline, mucosa normal, no drainage    or sinus tenderness  Throat:  Lips, mucosa, and tongue normal; teeth and gums normal  Neck:   Supple, symmetrical, trachea midline, no adenopathy;    thyroid:  no enlargement/tenderness/nodules; no carotid   bruit or JVD  Back:     Symmetric, no curvature, ROM normal, no CVA tenderness  Lungs:     Clear to auscultation bilaterally, respirations unlabored  Chest Wall:    No tenderness or deformity   Heart:    Regular rate and rhythm, S1 and S2 normal, no murmur, rub or gallop  Breast Exam:    Deferred  Abdomen:     Soft, non-tender, bowel sounds active all four quadrants,    no masses, no organomegaly  Genitalia:    Deferred  Extremities:   Extremities normal, atraumatic, no cyanosis or edema  Pulses:   2+ and symmetric all extremities  Skin:   Skin color, texture, turgor normal, no rashes or lesions  Lymph nodes:   Cervical, supraclavicular, and axillary nodes normal  Neurologic:   CNII-XII intact, normal strength, sensation and reflexes    throughout    Assessment/Plan:   Routine physical examination Today patient counseled on age appropriate routine health concerns for screening and prevention, each reviewed and up to date or declined. Immunizations reviewed and up to date or declined. Labs ordered and reviewed. Risk factors for depression reviewed and negative. Hearing function and visual acuity are intact. ADLs screened and addressed as needed. Functional ability and level of safety reviewed and  appropriate. Education, counseling and referrals performed based on assessed risks today. Patient provided with a copy of personalized plan for preventive services.  Primary hypertension Normotensive Continue to closely monitor and follow up as needed if any readings consistently >130/80  Iron deficiency anemia, unspecified iron deficiency anemia type Update iron levels and provide recommendations on supplementation  Mild persistent asthma without complication Overall stable Continue albuterol as needed Start daily allegra, add nasal spray as needed to help with springtime allergies Follow up as needed   Overweight Continue efforts at healthy lifestyle  Lipid screening Update lipid panel and provide recommendations   Screening examination for STD (sexually transmitted disease) Update per patient request Denies symptom(s)   Gastroesophageal reflux disease with esophagitis without hemorrhage Continue efforts at reducing triggers and high risk foods Continue omeprazole 40 mg daily  Jarold Motto, PA-C [place] [facility] [place]  I,Safa M Engineer, manufacturing systems as a Neurosurgeon for [employer], PA.,have documented all relevant documentation on the behalf of Jarold Motto, PA,as directed by  Jarold Motto, PA while in the presence of Jarold Motto, [place].   I, Jarold Motto, [place], have reviewed all documentation for this visit. The documentation on 05/26/23 for the exam, diagnosis, procedures, and orders are all accurate and complete.

## 2023-05-27 LAB — RPR: RPR Ser Ql: NONREACTIVE

## 2023-05-27 LAB — HIV ANTIBODY (ROUTINE TESTING W REFLEX): HIV 1&2 Ab, 4th Generation: NONREACTIVE

## 2023-05-29 ENCOUNTER — Other Ambulatory Visit

## 2023-05-29 ENCOUNTER — Encounter: Payer: Self-pay | Admitting: Physician Assistant

## 2023-05-29 LAB — URINE CYTOLOGY ANCILLARY ONLY
Chlamydia: NEGATIVE
Comment: NEGATIVE
Comment: NEGATIVE
Comment: NORMAL
Neisseria Gonorrhea: NEGATIVE
Trichomonas: NEGATIVE

## 2023-06-02 ENCOUNTER — Other Ambulatory Visit

## 2023-06-02 DIAGNOSIS — Z113 Encounter for screening for infections with a predominantly sexual mode of transmission: Secondary | ICD-10-CM

## 2023-06-02 NOTE — Addendum Note (Signed)
 Addended byZoe Lan on: 06/02/2023 03:28 PM   Modules accepted: Orders

## 2023-06-02 NOTE — Addendum Note (Signed)
 Addended by: Sumner Boast on: 06/02/2023 03:29 PM   Modules accepted: Orders

## 2023-06-03 LAB — URINALYSIS, ROUTINE W REFLEX MICROSCOPIC
Bilirubin Urine: NEGATIVE
Glucose, UA: NEGATIVE
Hgb urine dipstick: NEGATIVE
Hyaline Cast: NONE SEEN /LPF
Ketones, ur: NEGATIVE
Nitrite: NEGATIVE
Specific Gravity, Urine: 1.017 (ref 1.001–1.035)
pH: 7 (ref 5.0–8.0)

## 2023-06-03 LAB — EXTRA URINE SPECIMEN

## 2023-06-03 LAB — MICROSCOPIC MESSAGE

## 2023-06-04 ENCOUNTER — Encounter: Payer: Self-pay | Admitting: Physician Assistant

## 2023-06-05 ENCOUNTER — Encounter (HOSPITAL_BASED_OUTPATIENT_CLINIC_OR_DEPARTMENT_OTHER): Payer: Self-pay | Admitting: Obstetrics & Gynecology

## 2023-06-07 ENCOUNTER — Ambulatory Visit (HOSPITAL_BASED_OUTPATIENT_CLINIC_OR_DEPARTMENT_OTHER): Admitting: Certified Nurse Midwife

## 2023-06-07 ENCOUNTER — Other Ambulatory Visit (HOSPITAL_COMMUNITY)
Admission: RE | Admit: 2023-06-07 | Discharge: 2023-06-07 | Disposition: A | Discharge status: Home / Self Care | Source: Ambulatory Visit | Attending: Certified Nurse Midwife | Admitting: Certified Nurse Midwife

## 2023-06-07 ENCOUNTER — Encounter: Admitting: Physician Assistant

## 2023-06-07 ENCOUNTER — Encounter (HOSPITAL_BASED_OUTPATIENT_CLINIC_OR_DEPARTMENT_OTHER): Payer: Self-pay | Admitting: Certified Nurse Midwife

## 2023-06-07 VITALS — BP 134/69 | HR 76 | Ht 62.0 in | Wt 152.6 lb

## 2023-06-07 DIAGNOSIS — Z113 Encounter for screening for infections with a predominantly sexual mode of transmission: Secondary | ICD-10-CM | POA: Insufficient documentation

## 2023-06-07 DIAGNOSIS — R300 Dysuria: Secondary | ICD-10-CM | POA: Diagnosis not present

## 2023-06-07 DIAGNOSIS — Z124 Encounter for screening for malignant neoplasm of cervix: Secondary | ICD-10-CM | POA: Insufficient documentation

## 2023-06-07 DIAGNOSIS — B3731 Acute candidiasis of vulva and vagina: Secondary | ICD-10-CM | POA: Insufficient documentation

## 2023-06-07 DIAGNOSIS — N898 Other specified noninflammatory disorders of vagina: Secondary | ICD-10-CM | POA: Insufficient documentation

## 2023-06-07 DIAGNOSIS — Z01419 Encounter for gynecological examination (general) (routine) without abnormal findings: Secondary | ICD-10-CM

## 2023-06-07 DIAGNOSIS — Z01411 Encounter for gynecological examination (general) (routine) with abnormal findings: Secondary | ICD-10-CM

## 2023-06-07 MED ORDER — FLUCONAZOLE 150 MG PO TABS: 150.0000 mg | ORAL_TABLET | ORAL | 2 refills | Status: DC | PRN

## 2023-06-07 MED ORDER — METRONIDAZOLE 500 MG PO TABS: 500.0000 mg | ORAL_TABLET | Freq: Two times a day (BID) | ORAL | 3 refills | Status: DC

## 2023-06-07 NOTE — Progress Notes (Signed)
 ANNUAL EXAM Patient name: Erika Frazier MRN [id number]  Date of birth: [birth year] Chief Complaint:    Pt called requesting testing for BV and UTI.  She denies vaginal odor but states "something doesn't feel right".  History of Present Illness:   Erika Frazier is a 43 y.o. G0P0000  female being seen today for a routine annual exam.  Current complaints: Some dysuria at times, occasional vag odor (not today)  Patient's last menstrual period was 05/25/2023 (exact date).  Last pap 01/192022. Results were: NILM w/ HRHPV negative. H/O abnormal pap: yes Last mammogram: 08/12/2022. Results were: normal. Family h/o breast cancer: no Last colonoscopy: 08/20/2020. Results were: abnormal Polyp . She plans follow-up Colonoscopy 2027 due to polyp     06/07/2023   10:40 AM 05/26/2023    1:02 PM 11/23/2022    1:32 PM 06/07/2022    2:13 PM 06/03/2022    8:48 AM  Depression screen PHQ 2/9  Decreased Interest 0 0 0 1 0  Down, Depressed, Hopeless 0 0 0 0 0  PHQ - 2 Score 0 0 0 1 0  Altered sleeping    1   Tired, decreased energy    2   Change in appetite    3   Feeling bad or failure about yourself     0   Trouble concentrating    1   Moving slowly or fidgety/restless    1   PHQ-9 Score    9   Difficult doing work/chores    Somewhat difficult         06/07/2022    2:13 PM 02/12/2020    1:21 PM  GAD 7 : Generalized Anxiety Score  Nervous, Anxious, on Edge 1 0  Control/stop worrying 1 0  Worry too much - different things 1 1  Trouble relaxing 1 1  Restless 0 0  Easily annoyed or irritable 0 0  Afraid - awful might happen 0 0  Total GAD 7 Score 4 2  Anxiety Difficulty Somewhat difficult Somewhat difficult     Review of Systems:   Pertinent items are noted in HPI Denies any headaches, blurred vision, fatigue, shortness of breath, chest pain, abdominal pain, abnormal vaginal discharge/itching/odor/irritation, problems with periods, bowel movements, urination, or intercourse unless otherwise  stated above. Pertinent History Reviewed:  Reviewed past medical,surgical, social and family history.  Reviewed problem list, medications and allergies. Physical Assessment:   Vitals:   06/07/23 1030  BP: 134/69  Pulse: 76  Weight: 152 lb 9.6 oz (69.2 kg)  Height: 5\' 2"  (1.575 m)  Body mass index is 27.91 kg/m.        Physical Examination:   General appearance - well appearing, and in no distress  Mental status - alert, oriented to person, place, and time  Psych:  She has a normal mood and affect  Skin - warm and dry, normal color, no suspicious lesions noted  Chest - effort normal, all lung fields clear to auscultation bilaterally  Heart - normal rate and regular rhythm  Neck:  midline trachea, no thyromegaly or nodules  Breasts - breasts appear normal, no suspicious masses, no skin or nipple changes or  axillary nodes  Abdomen - soft, nontender, nondistended, no masses or organomegaly  Pelvic - VULVA: normal appearing vulva with no masses, tenderness or lesions  VAGINA: normal appearing vagina with normal color and discharge, no lesions  CERVIX: normal appearing cervix without discharge or lesions, no CMT  Thin prep pap is done with  HR HPV cotesting  UTERUS: uterus is felt to be normal size, shape, consistency and nontender   ADNEXA: No adnexal masses or tenderness noted.  Rectal - normal rectal, good sphincter tone  Extremities:  No swelling or varicosities noted  Chaperone present for exam  No results found for this or any previous visit (from the past 24 hours).  Assessment & Plan:   1. Encounter for annual routine gynecological examination - Continue self breast exams/self breast awareness and annual screening mammograms  2. Dysuria (Primary) - Urine Culture  3. Cervical cancer screening - Cytology - PAP( [place] [facility])  4. Vaginal irritation - Pt reports Hx recurrent BV (will prefer po if BV+) - Cervicovaginal ancillary only( [place])   Mammogram:  yearly   , or sooner if problems Colonoscopy: per GI, or sooner if problems  Orders Placed This Encounter  Procedures   Urine Culture    Meds:  Meds ordered this encounter  Medications   fluconazole (DIFLUCAN) 150 MG tablet    Sig: Take 1 tablet (150 mg total) by mouth every other day as needed.    Dispense:  2 tablet    Refill:  2    Supervising Provider:   Jerene Bears [[id number]]   metroNIDAZOLE (FLAGYL) 500 MG tablet    Sig: Take 1 tablet (500 mg total) by mouth 2 (two) times daily.    Dispense:  14 tablet    Refill:  3    Supervising Provider:   Jerene Bears [[id number]]    Follow-up: Return in about 1 year (around 06/06/2024) for annual gyn exam.  Letta Kocher, CNM 06/07/2023 12:40 PM

## 2023-06-08 ENCOUNTER — Encounter (HOSPITAL_BASED_OUTPATIENT_CLINIC_OR_DEPARTMENT_OTHER): Payer: Self-pay | Admitting: Certified Nurse Midwife

## 2023-06-08 LAB — CERVICOVAGINAL ANCILLARY ONLY
Bacterial Vaginitis (gardnerella): NEGATIVE
Candida Glabrata: NEGATIVE
Candida Vaginitis: POSITIVE — AB
Comment: NEGATIVE
Comment: NEGATIVE
Comment: NEGATIVE

## 2023-06-08 LAB — CYTOLOGY - PAP
Adequacy: ABSENT
Comment: NEGATIVE
Diagnosis: NEGATIVE
High risk HPV: NEGATIVE

## 2023-06-08 LAB — URINE CULTURE

## 2023-07-03 ENCOUNTER — Encounter: Payer: Self-pay | Admitting: Physician Assistant

## 2023-07-03 NOTE — Telephone Encounter (Signed)
LVM to schedule an ov 

## 2023-07-05 ENCOUNTER — Encounter: Payer: Self-pay | Admitting: Physician Assistant

## 2023-07-05 ENCOUNTER — Ambulatory Visit (INDEPENDENT_AMBULATORY_CARE_PROVIDER_SITE_OTHER): Admitting: Physician Assistant

## 2023-07-05 VITALS — BP 130/80 | HR 86 | Temp 98.2°F | Ht 62.0 in | Wt 151.2 lb

## 2023-07-05 DIAGNOSIS — J4541 Moderate persistent asthma with (acute) exacerbation: Secondary | ICD-10-CM

## 2023-07-05 MED ORDER — ALBUTEROL SULFATE (2.5 MG/3ML) 0.083% IN NEBU: 2.5000 mg | INHALATION_SOLUTION | Freq: Four times a day (QID) | RESPIRATORY_TRACT | 1 refills | Status: AC | PRN

## 2023-07-05 MED ORDER — METHYLPREDNISOLONE ACETATE 80 MG/ML IJ SUSP
80.0000 mg | Freq: Once | INTRAMUSCULAR | Status: AC
  Administered 2023-07-05: 80 mg via INTRAMUSCULAR

## 2023-07-05 MED ORDER — ALBUTEROL SULFATE HFA 108 (90 BASE) MCG/ACT IN AERS: 2.0000 | INHALATION_SPRAY | Freq: Four times a day (QID) | RESPIRATORY_TRACT | 3 refills | Status: DC | PRN

## 2023-07-05 NOTE — Patient Instructions (Signed)
 It was great to see you!  Steroid shot to be given today for your asthma  Use albuterol  as needed  If ANY worsening or NO improvement, let me know as soon as possible   Take care,  Alexander Iba PA-C

## 2023-07-05 NOTE — Progress Notes (Signed)
 Erika Frazier is a 43 y.o. female here for a follow up of a pre-existing problem.  History of Present Illness:   Chief Complaint  Patient presents with   Asthma    Pt c/o Asthma flare for the past week, pt has been SOB and wheezing. Pt is using Albuterol  inhaler.   Asthma flare-up: Pt complains of an asthma flare up with associated SOB and wheezing in the last 1-2 weeks.  Her symptoms tend to worsen at nighttime. Reports her boyfriend woke her up last night due to her wheezing while asleep and adds this is the first time this has occurred.  She has been using albuterol  1-2 times daily. She also endorses some nasal congestion, but attributes this to her seasonal allergies.  Of note, she does have a small dog at home, but makes sure to vacuum at least 2 times per week.  Her dog is only allowed in rooms with hardwood floors and is not allowed in rooms with carpet.  Denies any cough, chest pain, BLE edema.   Past Medical History:  Diagnosis Date   Allergy    Anxiety    COVID-19 04/27/2021   Depression    Fibroids, subserous    GERD (gastroesophageal reflux disease)    History of ovarian cyst    Hypertension    IDA (iron deficiency anemia)    Microcytic anemia    Mild persistent asthma    Wears glasses      Social History   Tobacco Use   Smoking status: Former    Types: Cigars   Smokeless tobacco: Never   Tobacco comments:    Doesn't smoke tobacco  Vaping Use   Vaping status: Never Used  Substance Use Topics   Alcohol use: Yes    Alcohol/week: 1.0 standard drink of alcohol    Types: 1 Glasses of wine per week    Comment: occasional   Drug use: Yes    Frequency: 2.0 times per week    Types: Marijuana    Comment: 10-08-2020  per pt smokes average 3 times per week    Past Surgical History:  Procedure Laterality Date   COLONOSCOPY  08/20/2020   @ [facility]   ESOPHAGOGASTRODUODENOSCOPY  01/07/2020   @ [facility]   HERNIA REPAIR     HIATAL  HERNIA REPAIR N/A 02/10/2023   Procedure: LAPAROSCOPIC REPAIR OF SLIDING HIATAL HERNIA REPAIR WITH A NISSEN FUNDOPLICATION;  Surgeon: Aldean Hummingbird, MD;  Location: [facility];  Service: General;  Laterality: N/A;   HYSTEROSCOPY N/A 10/13/2020   Procedure: HYSTEROSCOPY, DILATION AND CURETTAGE;  Surgeon: Lillian Rein, MD;  Location: [facility];  Service: Gynecology;  Laterality: N/A;    Family History  Problem Relation Age of Onset   Hypertension Mother    Alcohol abuse Father        now in remission   Lupus Maternal Aunt    Heart disease Maternal Aunt    Depression Maternal Aunt    Colon cancer Maternal Great-grandmother    Breast cancer Neg Hx    Rectal cancer Neg Hx    Stomach cancer Neg Hx    Esophageal cancer Neg Hx     No Known Allergies  Current Medications:   Current Outpatient Medications:    azelastine  (ASTELIN ) 0.1 % nasal spray, Place 1 spray into both nostrils 2 (two) times daily. Use in each nostril as directed, Disp: 30 mL, Rfl: 12   Calcium Carb-Cholecalciferol (CALCIUM + D3 PO), Take  by mouth every other day. PER PT TAKES TWO TABLESPOONS QOD, Disp: , Rfl:    ferrous sulfate (FER-IN-SOL) 75 (15 Fe) MG/ML SOLN, Take by mouth every other day. PER PT ONE OUNCE QOD, Disp: , Rfl:    Maca Root 500 MG CAPS, Take 500 mg by mouth every other day., Disp: , Rfl:    mirtazapine  (REMERON ) 15 MG tablet, Take 1 tablet (15 mg total) by mouth at bedtime. (Patient taking differently: Take 15 mg by mouth at bedtime as needed.), Disp: 90 tablet, Rfl: 1   omeprazole  (PRILOSEC) 40 MG capsule, Take 1 capsule (40 mg total) by mouth 2 (two) times daily. (Patient taking differently: Take 40 mg by mouth daily.), Disp: 180 capsule, Rfl: 1   albuterol  (PROVENTIL ) (2.5 MG/3ML) 0.083% nebulizer solution, Take 3 mLs (2.5 mg total) by nebulization every 6 (six) hours as needed for wheezing or shortness of breath., Disp: 150 mL, Rfl: 1   albuterol  (VENTOLIN  HFA) 108 (90 Base) MCG/ACT  inhaler, Inhale 2 puffs into the lungs every 6 (six) hours as needed., Disp: 18 g, Rfl: 3   Review of Systems:   Negative unless otherwise specified per HPI.  Vitals:   Vitals:   07/05/23 1318  BP: 130/80  Pulse: 86  Temp: 98.2 F (36.8 C)  TempSrc: Temporal  SpO2: 98%  Weight: 151 lb 4 oz (68.6 kg)  Height: 5\' 2"  (1.575 m)     Body mass index is 27.66 kg/m.  Physical Exam:   Physical Exam Vitals and nursing note reviewed.  Constitutional:      General: She is not in acute distress.    Appearance: She is well-developed. She is not ill-appearing or toxic-appearing.  Cardiovascular:     Rate and Rhythm: Normal rate and regular rhythm.     Pulses: Normal pulses.     Heart sounds: Normal heart sounds, S1 normal and S2 normal.  Pulmonary:     Effort: Pulmonary effort is normal.     Breath sounds: Examination of the left-upper field reveals wheezing. Examination of the left-middle field reveals wheezing. Examination of the left-lower field reveals wheezing. Wheezing present.  Skin:    General: Skin is warm and dry.  Neurological:     Mental Status: She is alert.     GCS: GCS eye subscore is 4. GCS verbal subscore is 5. GCS motor subscore is 6.  Psychiatric:        Speech: Speech normal.        Behavior: Behavior normal. Behavior is cooperative.     Assessment and Plan:   1. Moderate persistent asthma with acute exacerbation (Primary) - methylPREDNISolone acetate (DEPO-MEDROL) injection 80 mg   No red flags on exam.   Patient is in no obvious respiratory distress Will provide steroid injection (80 mg provided today) Refill albuterol  inhaler and nebulizer solution Discussed that if her symptoms do not improve or if any new symptoms arise, let me know Next step will be chest x-ray and consideration of inhaled corticosteroid inhaler, consideration of antibiotics  per orders.  Discussed taking medications as prescribed.  Reviewed return precautions including new or  worsening fever, SOB, new or worsening cough or other concerns.  Push fluids and rest.  I recommend that patient follow-up if symptoms worsen or persist despite treatment x 7-10 days, sooner if needed.  I, Bernita Bristle, acting as a Neurosurgeon for Alexander Iba, [place]., have documented all relevant documentation on the behalf of Alexander Iba, [place], as directed by   while  in the presence of Alexander Iba, [place].  I, Alexander Iba, [place], have reviewed all documentation for this visit. The documentation on 07/05/23 for the exam, diagnosis, procedures, and orders are all accurate and complete.  Alexander Iba, PA-C

## 2023-07-16 ENCOUNTER — Encounter: Payer: Self-pay | Admitting: Physician Assistant

## 2023-07-17 ENCOUNTER — Other Ambulatory Visit: Payer: Self-pay | Admitting: Physician Assistant

## 2023-07-17 MED ORDER — QVAR REDIHALER 40 MCG/ACT IN AERB: 1.0000 | INHALATION_SPRAY | Freq: Two times a day (BID) | RESPIRATORY_TRACT | 1 refills | Status: DC

## 2023-07-17 NOTE — Telephone Encounter (Signed)
 Please see message and advise

## 2023-09-07 ENCOUNTER — Other Ambulatory Visit: Payer: Self-pay | Admitting: Physician Assistant

## 2023-09-07 DIAGNOSIS — Z1231 Encounter for screening mammogram for malignant neoplasm of breast: Secondary | ICD-10-CM

## 2023-09-08 ENCOUNTER — Encounter

## 2023-09-08 DIAGNOSIS — Z1231 Encounter for screening mammogram for malignant neoplasm of breast: Secondary | ICD-10-CM

## 2023-09-13 ENCOUNTER — Ambulatory Visit

## 2023-09-13 ENCOUNTER — Ambulatory Visit
Admission: RE | Admit: 2023-09-13 | Discharge: 2023-09-13 | Disposition: A | Discharge status: Home / Self Care | Source: Ambulatory Visit | Attending: Physician Assistant | Admitting: Physician Assistant

## 2023-09-13 DIAGNOSIS — Z1231 Encounter for screening mammogram for malignant neoplasm of breast: Secondary | ICD-10-CM | POA: Diagnosis not present

## 2023-09-29 ENCOUNTER — Ambulatory Visit (HOSPITAL_BASED_OUTPATIENT_CLINIC_OR_DEPARTMENT_OTHER): Admitting: Certified Nurse Midwife

## 2023-11-14 IMAGING — MG MM DIGITAL DIAGNOSTIC UNILAT*R* W/ TOMO W/ CAD
6 series · 6 of 18 positions shown · non-contrast
Comparison: Previous exam(s).

CLINICAL DATA: Screening recall for possible right breast
architectural distortion

EXAM:
DIGITAL DIAGNOSTIC UNILATERAL RIGHT MAMMOGRAM WITH TOMOSYNTHESIS AND
CAD; ULTRASOUND RIGHT BREAST LIMITED
TECHNIQUE: Right digital diagnostic mammography and breast tomosynthesis was
performed. The images were evaluated with computer-aided detection.;
Targeted ultrasound examination of the right breast was performed

[R MLO synth-2D]
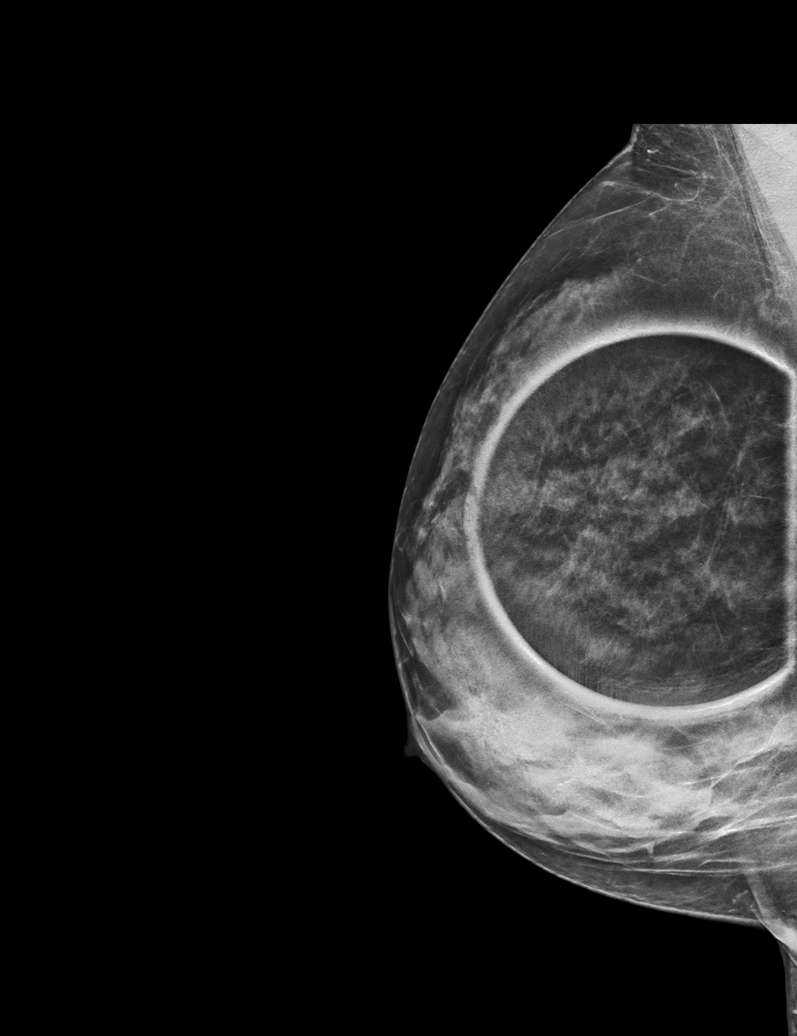

[R ML synth-2D]
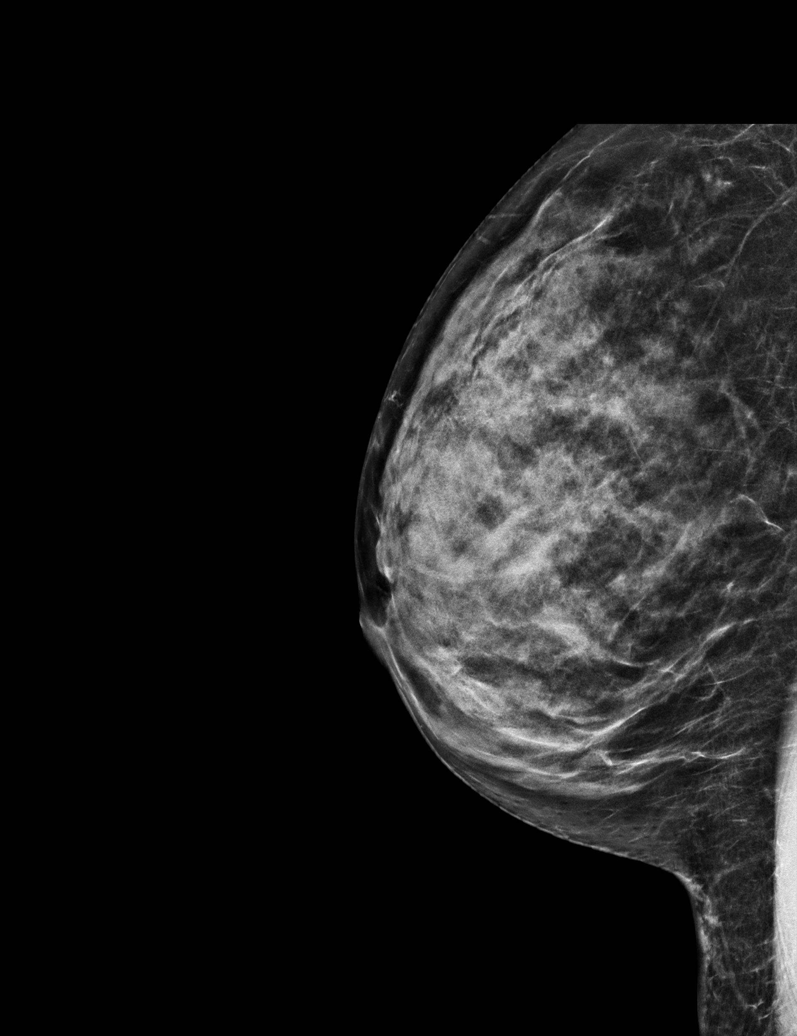

[R CC synth-2D]
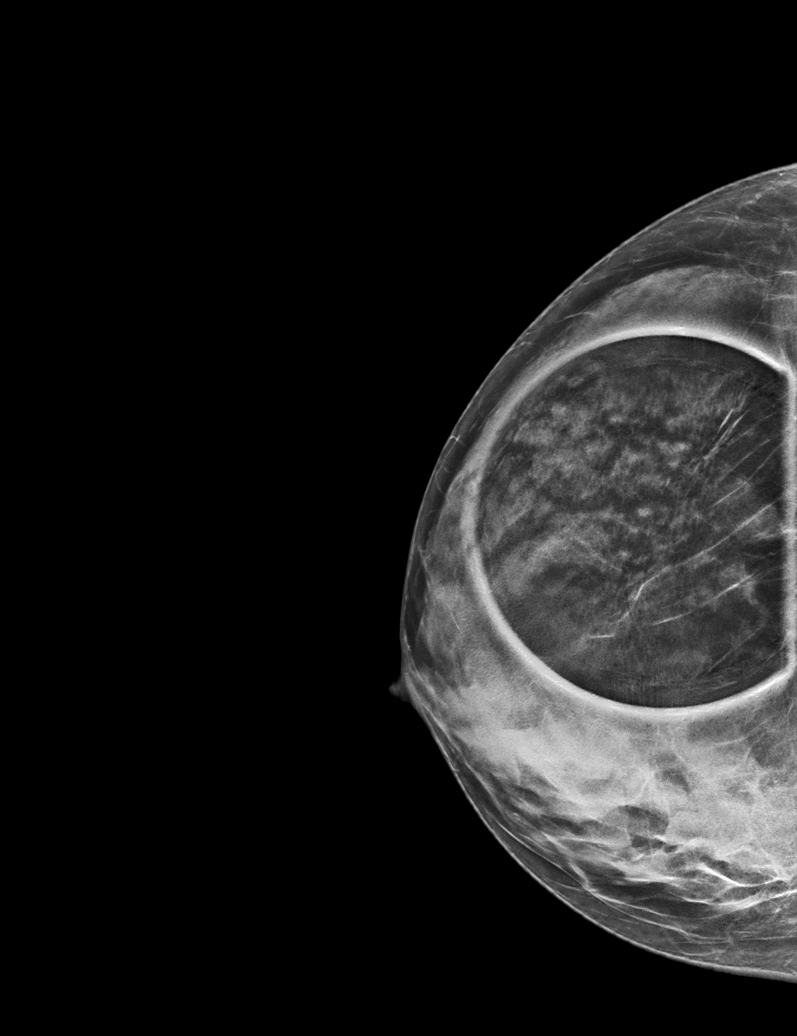

[R ML tomo · tomo slice 26/51.0]
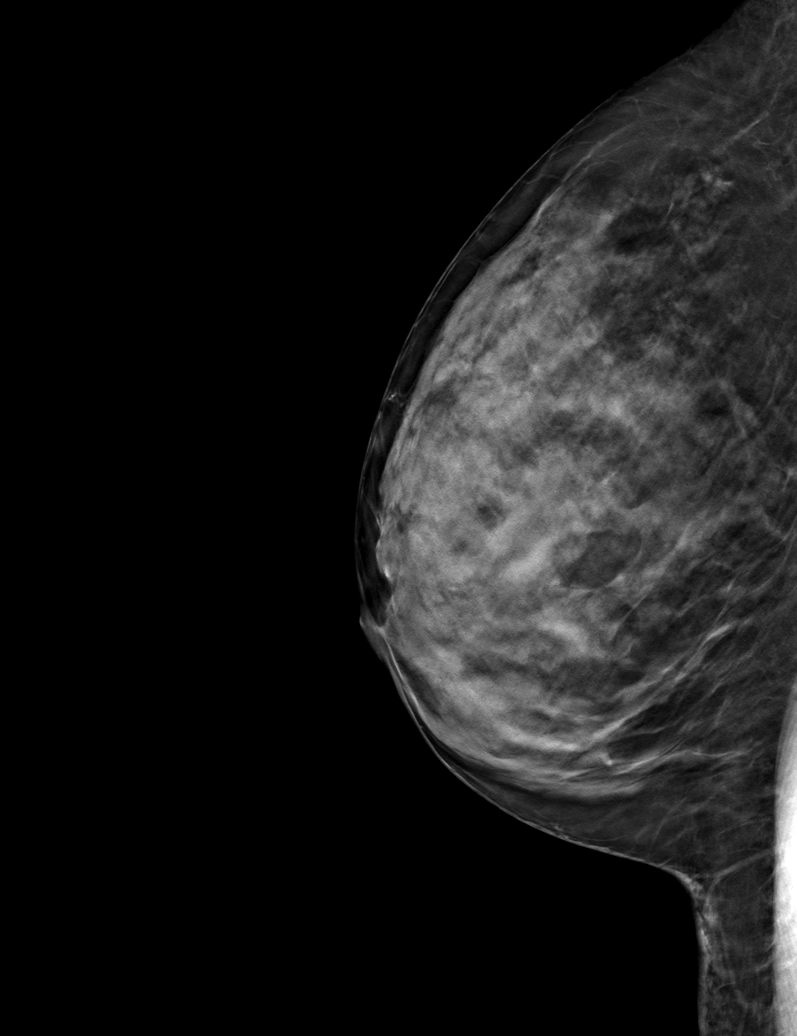

[R MLO tomo · tomo slice 22/43.0]
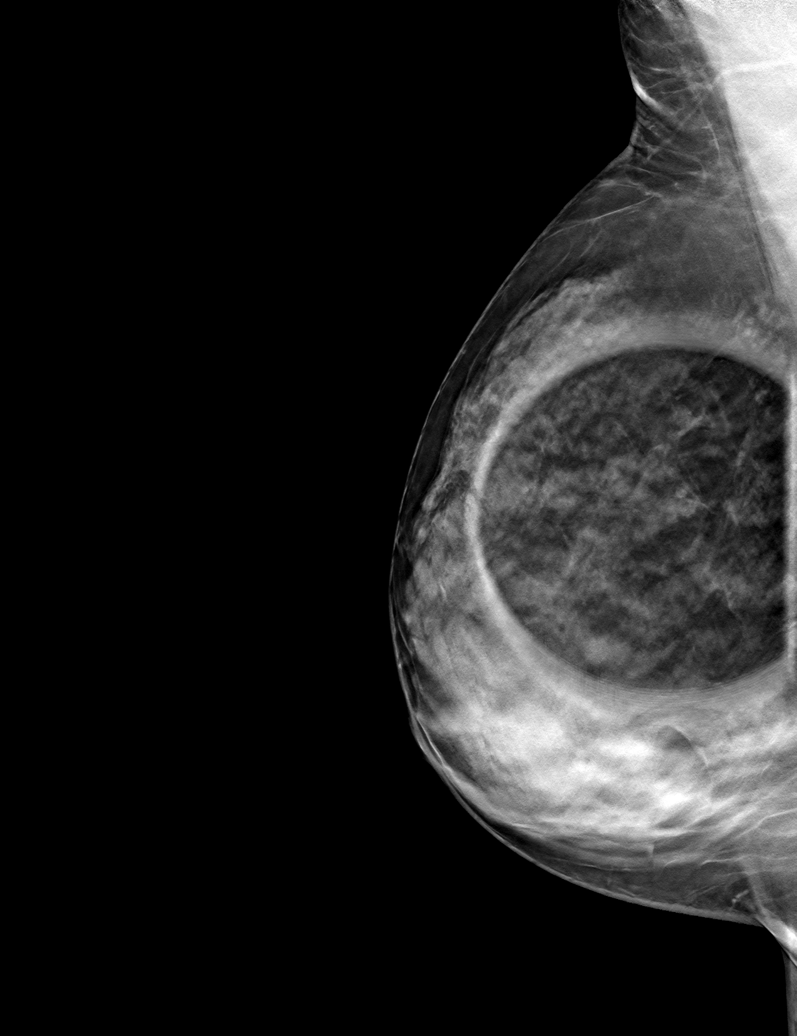

[R CC tomo · tomo slice 23/46.0]
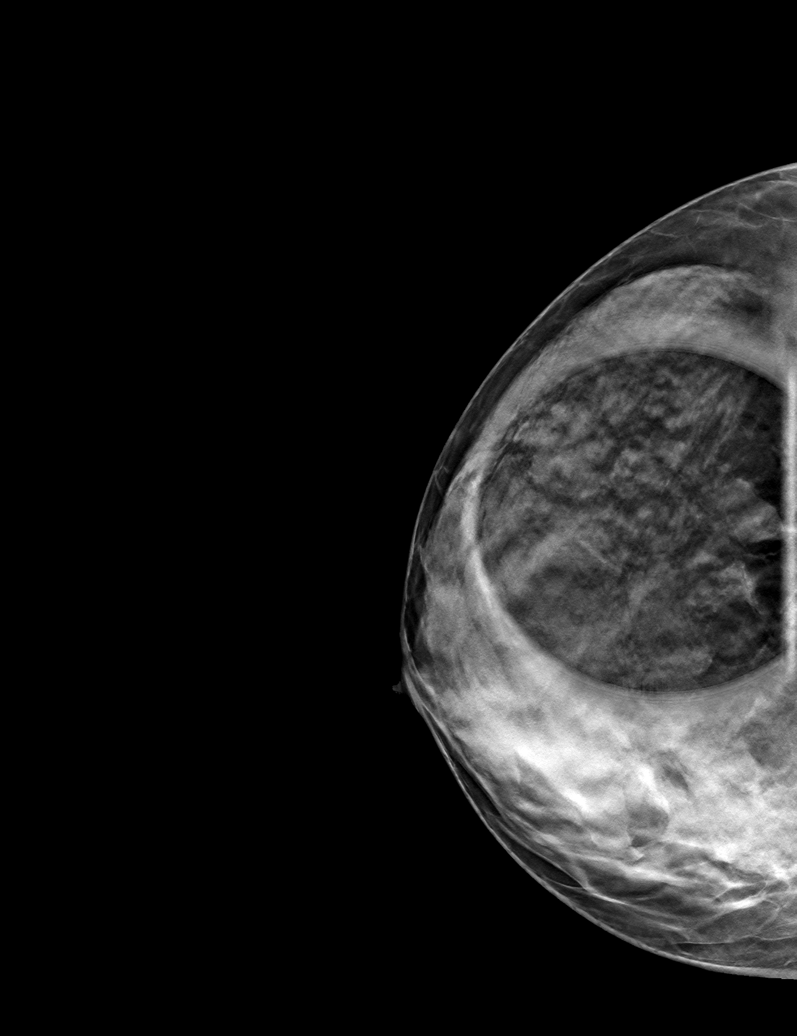

[6 of 18 positions shown; findings below may reference images not displayed]

ACR Breast Density Category d: The breast tissue is extremely dense,
which lowers the sensitivity of mammography.
FINDINGS: On spot compression imaging, the possible architectural distortion,
suggested in right breast upper outer quadrant on the current
screening exam, disperses consistent with superimposed
fibroglandular tissue. There is no underlying mass or true
architectural distortion.

On physical exam, no mass is palpated in the upper outer right
breast.

Targeted ultrasound is performed, showing normal fibroglandular
tissue throughout the upper outer and lateral right breast. No mass.
No sonographic distortion.
IMPRESSION: No evidence of breast malignancy.

RECOMMENDATION:
Screening mammogram in one year.(Code:YQ-5-8CP)

I have discussed the findings and recommendations with the patient.
If applicable, a reminder letter will be sent to the patient
regarding the next appointment.

BI-RADS CATEGORY  1: Negative.

## 2023-11-24 ENCOUNTER — Ambulatory Visit (HOSPITAL_BASED_OUTPATIENT_CLINIC_OR_DEPARTMENT_OTHER): Admitting: Certified Nurse Midwife

## 2023-11-24 ENCOUNTER — Ambulatory Visit: Admitting: Physician Assistant

## 2023-11-27 ENCOUNTER — Ambulatory Visit: Admitting: Physician Assistant

## 2024-01-16 ENCOUNTER — Ambulatory Visit: Admitting: Physician Assistant

## 2024-01-16 ENCOUNTER — Encounter: Payer: Self-pay | Admitting: Physician Assistant

## 2024-01-16 VITALS — BP 130/90 | HR 87 | Temp 97.7°F | Ht 62.0 in | Wt 163.0 lb

## 2024-01-16 DIAGNOSIS — Z23 Encounter for immunization: Secondary | ICD-10-CM | POA: Diagnosis not present

## 2024-01-16 DIAGNOSIS — J4541 Moderate persistent asthma with (acute) exacerbation: Secondary | ICD-10-CM | POA: Diagnosis not present

## 2024-01-16 DIAGNOSIS — H9203 Otalgia, bilateral: Secondary | ICD-10-CM

## 2024-01-16 DIAGNOSIS — N951 Menopausal and female climacteric states: Secondary | ICD-10-CM

## 2024-01-16 DIAGNOSIS — R635 Abnormal weight gain: Secondary | ICD-10-CM

## 2024-01-16 DIAGNOSIS — R1013 Epigastric pain: Secondary | ICD-10-CM

## 2024-01-16 DIAGNOSIS — R0789 Other chest pain: Secondary | ICD-10-CM

## 2024-01-16 DIAGNOSIS — F5101 Primary insomnia: Secondary | ICD-10-CM

## 2024-01-16 LAB — COMPREHENSIVE METABOLIC PANEL WITH GFR
ALT: 13 U/L (ref 0–35)
AST: 13 U/L (ref 0–37)
Albumin: 4 g/dL (ref 3.5–5.2)
Alkaline Phosphatase: 69 U/L (ref 39–117)
BUN: 12 mg/dL (ref 6–23)
CO2: 29 meq/L (ref 19–32)
Calcium: 9.2 mg/dL (ref 8.4–10.5)
Chloride: 104 meq/L (ref 96–112)
Creatinine, Ser: 0.88 mg/dL (ref 0.40–1.20)
GFR: 80.9 mL/min (ref >60.00)
Glucose, Bld: 102 mg/dL — ABNORMAL HIGH (ref 70–99)
Potassium: 4.2 meq/L (ref 3.5–5.1)
Sodium: 141 meq/L (ref 135–145)
Total Bilirubin: 0.4 mg/dL (ref 0.2–1.2)
Total Protein: 6.3 g/dL (ref 6.0–8.3)

## 2024-01-16 LAB — CBC WITH DIFFERENTIAL/PLATELET
Basophils Absolute: 0 K/uL (ref 0.0–0.1)
Basophils Relative: 0.5 % (ref 0.0–3.0)
Eosinophils Absolute: 0.1 K/uL (ref 0.0–0.7)
Eosinophils Relative: 2 % (ref 0.0–5.0)
HCT: 34.9 % — ABNORMAL LOW (ref 36.0–46.0)
Hemoglobin: 11.2 g/dL — ABNORMAL LOW (ref 12.0–15.0)
Lymphocytes Relative: 35.4 % (ref 12.0–46.0)
Lymphs Abs: 1.9 K/uL (ref 0.7–4.0)
MCHC: 32 g/dL (ref 30.0–36.0)
MCV: 73.3 fl — ABNORMAL LOW (ref 78.0–100.0)
Monocytes Absolute: 0.4 K/uL (ref 0.1–1.0)
Monocytes Relative: 7.6 % (ref 3.0–12.0)
Neutro Abs: 2.9 K/uL (ref 1.4–7.7)
Neutrophils Relative %: 54.5 % (ref 43.0–77.0)
Platelets: 377 K/uL (ref 150.0–400.0)
RBC: 4.77 Mil/uL (ref 3.87–5.11)
RDW: 14.4 % (ref 11.5–15.5)
WBC: 5.3 K/uL (ref 4.0–10.5)

## 2024-01-16 LAB — TSH: TSH: 1.96 u[IU]/mL (ref 0.35–5.50)

## 2024-01-16 MED ORDER — PREDNISONE 20 MG PO TABS: 20.0000 mg | ORAL_TABLET | Freq: Two times a day (BID) | ORAL | 0 refills | Status: DC

## 2024-01-16 MED ORDER — TRAZODONE HCL 50 MG PO TABS
25.0000 mg | ORAL_TABLET | Freq: Every evening | ORAL | 3 refills | Status: AC | PRN
Start: 2024-01-16 — End: ?

## 2024-01-16 MED ORDER — QVAR REDIHALER 40 MCG/ACT IN AERB: 1.0000 | INHALATION_SPRAY | Freq: Two times a day (BID) | RESPIRATORY_TRACT | 1 refills | Status: AC

## 2024-01-16 MED ORDER — ALBUTEROL SULFATE HFA 108 (90 BASE) MCG/ACT IN AERS: 2.0000 | INHALATION_SPRAY | Freq: Four times a day (QID) | RESPIRATORY_TRACT | 3 refills | Status: AC | PRN

## 2024-01-16 NOTE — Progress Notes (Signed)
 Erika Frazier is a 43 y.o. female here for a follow up of a pre-existing problem.  History of Present Illness:   Chief Complaint  Patient presents with   Otalgia    Pt c/o bilateral ear pain x 1 week, right > left.   Shortness of Breath    Pt c/o SOB x 1 week, thinks asthma flare, using rescue inhaler twice a day.   Shoulder Pain    Pt c/o right shoulder pain, numbness and tingling down arm, worse past month.    Discussed the use of AI scribe software for clinical note transcription with the patient, who gave verbal consent to proceed.  History of Present Illness   Erika Frazier is a 43 year old female who presents with weight gain and respiratory issues.  Over the past six months, she has gained 12 pounds, increasing from 151 pounds in May to 163 pounds currently. She experiences increased difficulty breathing and worsening asthma symptoms, particularly during her 12-hour work shifts, leading to frequent use of her albuterol  inhaler. She has been eating less in an attempt to lose weight. She is still using her QVAR . Abdominal bloating occurs by the end of the day, with her abdomen becoming hard. She denies any known food intolerances and reports regular bowel movements.  For the past week, she has experienced bilateral earaches, with the right ear being more affected, and has used ear drops without relief. Rib pain persists since her hiatal hernia repair surgery, now presenting as a persistent ache in the front of her ribs, not associated with coughing or phlegm production.  She reports various other symptom(s) including irregular menstrual cycles, occasional eye twitching, and tingling in her arm. Her current medications include mirtazapine , taken two to three times a week for sleep, and frequent use of her rescue inhaler. She lives with a smoker but stays in her own room with a humidifier to minimize exposure.        Past Medical History:  Diagnosis Date   Allergy    Anxiety     COVID-19 04/27/2021   Depression    Fibroids, subserous    GERD (gastroesophageal reflux disease)    History of ovarian cyst    Hypertension    IDA (iron deficiency anemia)    Microcytic anemia    Mild persistent asthma    Wears glasses      Social History   Tobacco Use   Smoking status: Former    Types: Cigars   Smokeless tobacco: Never   Tobacco comments:    Doesn't smoke tobacco  Vaping Use   Vaping status: Never Used  Substance Use Topics   Alcohol use: Yes    Alcohol/week: 1.0 standard drink of alcohol    Types: 1 Glasses of wine per week    Comment: occasional   Drug use: Yes    Frequency: 2.0 times per week    Types: Marijuana    Comment: 10-08-2020  per pt smokes average 3 times per week    Past Surgical History:  Procedure Laterality Date   COLONOSCOPY  08/20/2020   @ [facility]   ESOPHAGOGASTRODUODENOSCOPY  01/07/2020   @ [facility]   HERNIA REPAIR     HIATAL HERNIA REPAIR N/A 02/10/2023   Procedure: LAPAROSCOPIC REPAIR OF SLIDING HIATAL HERNIA REPAIR WITH A NISSEN FUNDOPLICATION;  Surgeon: Tanda Locus, MD;  Location: [facility];  Service: General;  Laterality: N/A;   HYSTEROSCOPY N/A 10/13/2020   Procedure: HYSTEROSCOPY, DILATION AND CURETTAGE;  Surgeon: Cleotilde Ronal RAMAN, MD;  Location: [facility];  Service: Gynecology;  Laterality: N/A;    Family History  Problem Relation Age of Onset   Hypertension Mother    Alcohol abuse Father        now in remission   Lupus Maternal Aunt    Heart disease Maternal Aunt    Depression Maternal Aunt    Colon cancer Maternal Great-grandmother    Breast cancer Neg Hx    Rectal cancer Neg Hx    Stomach cancer Neg Hx    Esophageal cancer Neg Hx     No Known Allergies  Current Medications:   Current Outpatient Medications:    albuterol  (PROVENTIL ) (2.5 MG/3ML) 0.083% nebulizer solution, Take 3 mLs (2.5 mg total) by nebulization every 6 (six) hours as needed for wheezing or  shortness of breath., Disp: 150 mL, Rfl: 1   azelastine  (ASTELIN ) 0.1 % nasal spray, Place 1 spray into both nostrils 2 (two) times daily. Use in each nostril as directed, Disp: 30 mL, Rfl: 12   Calcium Carb-Cholecalciferol (CALCIUM + D3 PO), Take by mouth every other day. PER PT TAKES TWO TABLESPOONS QOD, Disp: , Rfl:    ferrous sulfate (FER-IN-SOL) 75 (15 Fe) MG/ML SOLN, Take by mouth every other day. PER PT ONE OUNCE QOD, Disp: , Rfl:    Maca Root 500 MG CAPS, Take 500 mg by mouth every other day., Disp: , Rfl:    omeprazole  (PRILOSEC) 40 MG capsule, Take 1 capsule (40 mg total) by mouth 2 (two) times daily. (Patient taking differently: Take 40 mg by mouth daily.), Disp: 180 capsule, Rfl: 1   predniSONE  (DELTASONE ) 20 MG tablet, Take 1 tablet (20 mg total) by mouth 2 (two) times daily with a meal., Disp: 10 tablet, Rfl: 0   traZODone (DESYREL) 50 MG tablet, Take 0.5-1 tablets (25-50 mg total) by mouth at bedtime as needed for sleep., Disp: 30 tablet, Rfl: 3   albuterol  (VENTOLIN  HFA) 108 (90 Base) MCG/ACT inhaler, Inhale 2 puffs into the lungs every 6 (six) hours as needed., Disp: 18 g, Rfl: 3   beclomethasone (QVAR  REDIHALER) 40 MCG/ACT inhaler, Inhale 1 puff into the lungs 2 (two) times daily., Disp: 1 each, Rfl: 1   Review of Systems:   Negative unless otherwise specified per HPI.  Vitals:   Vitals:   01/16/24 1032  BP: (!) 130/90  Pulse: 87  Temp: 97.7 F (36.5 C)  TempSrc: Temporal  SpO2: 99%  Weight: 163 lb (73.9 kg)  Height: 5' 2 (1.575 m)     Body mass index is 29.81 kg/m.  Physical Exam:   Physical Exam Vitals and nursing note reviewed.  Constitutional:      General: She is not in acute distress.    Appearance: She is well-developed. She is not ill-appearing or toxic-appearing.  HENT:     Right Ear: A middle ear effusion is present. Tympanic membrane is not scarred, perforated, erythematous or retracted.     Left Ear: A middle ear effusion is present. Tympanic  membrane is not scarred, perforated, erythematous or retracted.  Cardiovascular:     Rate and Rhythm: Normal rate and regular rhythm.     Pulses: Normal pulses.     Heart sounds: Normal heart sounds, S1 normal and S2 normal.  Pulmonary:     Effort: Pulmonary effort is normal.     Breath sounds: Normal breath sounds.  Musculoskeletal:     Comments: No tenderness to palpation to rib  cage  Skin:    General: Skin is warm and dry.  Neurological:     Mental Status: She is alert.     GCS: GCS eye subscore is 4. GCS verbal subscore is 5. GCS motor subscore is 6.  Psychiatric:        Speech: Speech normal.        Behavior: Behavior normal. Behavior is cooperative.     Assessment and Plan:   Assessment and Plan    Moderate persistent asthma with acute exacerbation Increased rescue inhaler use and exertional dyspnea. Possible exacerbation due to weight gain and fluid retention. - Prescribed prednisone  for acute exacerbation. - Prescribed QVAR  and refilled albuterol   Weight gain Weight increased from 151 lbs to 163 lbs. Possible factors: mirtazapine  use, dietary habits. - Advised dietary modifications: reduce bread and dairy. - Encouraged regular exercise with home equipment. - Update blood work  Bilateral otalgia with middle ear effusion Bilateral ear pain with fluid behind eardrum, right worse than left. No infection signs. - Restart Astelin  nasal spray to clear fluid.  Epigastric pain Intermittent pain and bloating, possibly dietary or perimenopausal. No food intolerances. - Ordered celiac gluten panel. - Advised dietary modifications: reduce bread and dairy. - Encouraged gastroenterologist follow-up.  Perimenopausal symptoms Irregular cycles, bloating, joint pain contributing to discomfort. - Provided educational material on perimenopause. - Encouraged lifestyle modifications.  Rib pain No red flags Unclear etiology Recommend follow up with surgeon if persists given she  had this post-surgically  Insomnia Intermittent insomnia, previously managed with mirtazapine , contributing to weight gain. - Switched from mirtazapine  to trazodone.   Lucie Buttner, PA-C

## 2024-01-17 LAB — GLIA (IGA/G) + TTG IGA
Deamidated Gliadin Abs, IgA: 4 U (ref 0–19)
Deamidated Gliadin Abs, IgG: 4 U (ref 0–19)
t-Transglutaminase (tTG) IgA: <2 U/mL (ref 0–3)

## 2024-01-18 ENCOUNTER — Ambulatory Visit: Payer: Self-pay | Admitting: Physician Assistant

## 2024-02-06 ENCOUNTER — Other Ambulatory Visit: Payer: Self-pay | Admitting: Physician Assistant

## 2024-02-26 ENCOUNTER — Encounter: Payer: Self-pay | Admitting: Physician Assistant

## 2024-02-26 NOTE — Telephone Encounter (Signed)
 Called pt she is going to schedule an appt

## 2024-02-27 ENCOUNTER — Telehealth: Admitting: Family Medicine

## 2024-02-27 ENCOUNTER — Encounter: Payer: Self-pay | Admitting: Family Medicine

## 2024-02-27 DIAGNOSIS — J4541 Moderate persistent asthma with (acute) exacerbation: Secondary | ICD-10-CM

## 2024-02-27 DIAGNOSIS — J0100 Acute maxillary sinusitis, unspecified: Secondary | ICD-10-CM | POA: Diagnosis not present

## 2024-02-27 DIAGNOSIS — N3000 Acute cystitis without hematuria: Secondary | ICD-10-CM | POA: Diagnosis not present

## 2024-02-27 MED ORDER — FLUCONAZOLE 150 MG PO TABS: 150.0000 mg | ORAL_TABLET | ORAL | 0 refills | Status: DC | PRN

## 2024-02-27 MED ORDER — CEFDINIR 300 MG PO CAPS: 300.0000 mg | ORAL_CAPSULE | Freq: Two times a day (BID) | ORAL | 0 refills | Status: DC

## 2024-02-27 MED ORDER — PREDNISONE 20 MG PO TABS: 40.0000 mg | ORAL_TABLET | Freq: Every day | ORAL | 0 refills | Status: DC

## 2024-02-27 NOTE — Patient Instructions (Signed)
 Happy New year  Meds sent  Let us  know if worse, not improving

## 2024-02-27 NOTE — Progress Notes (Signed)
 " MyChart Video Visit Virtual Visit via Video Note   This visit type was conducted w/patient consent. This format is felt to be most appropriate for this patient at this time. Physical exam was limited by quality of the video and audio technology used for the visit. CMA was able to get the patient set up on a video visit.  Patient location: Home. Patient and provider in visit Provider location: Office  I discussed the limitations of evaluation and management by telemedicine and the availability of in person appointments. The patient expressed understanding and agreed to proceed.  Visit Date: 02/27/2024  Today's healthcare provider: Jenkins CHRISTELLA Carrel, MD     Subjective:    Patient ID: Erika Frazier, female    DOB: 08-02-1980, 43 y.o.   MRN: [id number]  Chief Complaint  Patient presents with   Urinary Frequency    Pt has urinary frequency x 1 day, also has cough, congestion x8 days    Discussed the use of AI scribe software for clinical note transcription with the patient, who gave verbal consent to proceed.  History of Present Illness Erika Frazier is a 43 year old female with asthma who presents with respiratory congestion and urinary frequency.  She has experienced significant respiratory congestion for over a week, describing her nasal and sinus cavities as 'miserable' with tightness and aching in her nose. There is a large amount of nasal discharge and difficulty swallowing due to mucus accumulation. She has been coughing to clear the mucus, which she feels is stuck between her nose and head.  She reports body aches and intermittent chills but no fever. There is increased use of her inhaler due to shortness of breath, consistent with her history of asthma. She is not currently taking prednisone , as it was previously used for a short-term situation.  In addition to respiratory symptoms, she reports urinary frequency for the past two to three days. There is a constant need to urinate  without burning sensation, but with a feeling of bladder fullness and pressure. The frequency is more pronounced during the day, and she feels the need to urinate again shortly after voiding, even without fluid intake. She has experienced similar symptoms in the past with urinary tract infections. She reports some back pain, which she attributes to her respiratory issues, and there is some lower abdominal pressure when her bladder is full. She has been drinking cranberry juice in an attempt to alleviate her urinary symptoms.    Past Medical History:  Diagnosis Date   Allergy    Anxiety    COVID-19 04/27/2021   Depression    Fibroids, subserous    GERD (gastroesophageal reflux disease)    History of ovarian cyst    Hypertension    IDA (iron deficiency anemia)    Microcytic anemia    Mild persistent asthma    Wears glasses     Past Surgical History:  Procedure Laterality Date   COLONOSCOPY  08/20/2020   @ [facility]   ESOPHAGOGASTRODUODENOSCOPY  01/07/2020   @ [facility]   HERNIA REPAIR     HIATAL HERNIA REPAIR N/A 02/10/2023   Procedure: LAPAROSCOPIC REPAIR OF SLIDING HIATAL HERNIA REPAIR WITH A NISSEN FUNDOPLICATION;  Surgeon: Tanda Locus, MD;  Location: [facility];  Service: General;  Laterality: N/A;   HYSTEROSCOPY N/A 10/13/2020   Procedure: HYSTEROSCOPY, DILATION AND CURETTAGE;  Surgeon: Cleotilde Ronal RAMAN, MD;  Location: [facility];  Service: Gynecology;  Laterality: N/A;    Outpatient  Medications Prior to Visit  Medication Sig Dispense Refill   albuterol  (PROVENTIL ) (2.5 MG/3ML) 0.083% nebulizer solution Take 3 mLs (2.5 mg total) by nebulization every 6 (six) hours as needed for wheezing or shortness of breath. 150 mL 1   albuterol  (VENTOLIN  HFA) 108 (90 Base) MCG/ACT inhaler Inhale 2 puffs into the lungs every 6 (six) hours as needed. 18 g 3   azelastine  (ASTELIN ) 0.1 % nasal spray Place 1 spray into both nostrils 2 (two) times daily. Use in  each nostril as directed 30 mL 12   beclomethasone (QVAR  REDIHALER) 40 MCG/ACT inhaler Inhale 1 puff into the lungs 2 (two) times daily. 1 each 1   Calcium Carb-Cholecalciferol (CALCIUM + D3 PO) Take by mouth every other day. PER PT TAKES TWO TABLESPOONS QOD     ferrous sulfate (FER-IN-SOL) 75 (15 Fe) MG/ML SOLN Take by mouth every other day. PER PT ONE OUNCE QOD     Maca Root 500 MG CAPS Take 500 mg by mouth every other day.     omeprazole  (PRILOSEC) 40 MG capsule Take 1 capsule (40 mg total) by mouth 2 (two) times daily. (Patient taking differently: Take 40 mg by mouth daily.) 180 capsule 1   traZODone  (DESYREL ) 50 MG tablet Take 0.5-1 tablets (25-50 mg total) by mouth at bedtime as needed for sleep. 30 tablet 3   predniSONE  (DELTASONE ) 20 MG tablet Take 1 tablet (20 mg total) by mouth 2 (two) times daily with a meal. 10 tablet 0   No facility-administered medications prior to visit.    Allergies[1]      Objective:     Physical Exam  Vitals and nursing note reviewed.  Constitutional:      General:  is not in acute distress.    Appearance: Normal appearance.  HENT:     Head: Normocephalic.  Pulmonary:     Effort: No respiratory distress.  Skin:    General: Skin is dry.     Coloration: Skin is not pale.  Neurological:     Mental Status: Pt is alert and oriented to person, place, and time.  Psychiatric:        Mood and Affect: Mood normal.   There were no vitals taken for this visit.  Wt Readings from Last 3 Encounters:  01/16/24 163 lb (73.9 kg)  07/05/23 151 lb 4 oz (68.6 kg)  06/07/23 152 lb 9.6 oz (69.2 kg)       Assessment & Plan:   Problem List Items Addressed This Visit   None Visit Diagnoses       Acute non-recurrent maxillary sinusitis    -  Primary   Relevant Medications   cefdinir (OMNICEF) 300 MG capsule   predniSONE  (DELTASONE ) 20 MG tablet   fluconazole  (DIFLUCAN ) 150 MG tablet     Moderate persistent asthma with acute exacerbation       Relevant  Medications   predniSONE  (DELTASONE ) 20 MG tablet     Acute cystitis without hematuria           Assessment and Plan Assessment & Plan Acute upper respiratory infection   She experiences nasal congestion, sinus pressure, and cough for over a week without fever or shortness of breath. Her inhaler use has increased due to respiratory symptoms, but there is no lung involvement. Start Omnicef to address respiratory symptoms. Continue using a humidifier and maintain hydration. Use Mucinex if it provides relief.  Urinary tract infection   She reports increased urinary frequency and bladder pressure for  the past two to three days, without dysuria or back pain. Symptoms suggest a UTI, but no urine culture is available for confirmation. Omnicef is prescribed to cover a potential UTI. Monitor symptoms and report if there is no improvement.  Asthma with acute exacerbation   Her inhaler use has increased due to respiratory symptoms, but she reports no shortness of breath. Prescribe prednisone  to manage the asthma exacerbation. Ensure inhaler refills are up to date.      Meds ordered this encounter  Medications   cefdinir (OMNICEF) 300 MG capsule    Sig: Take 1 capsule (300 mg total) by mouth 2 (two) times daily.    Dispense:  14 capsule    Refill:  0   predniSONE  (DELTASONE ) 20 MG tablet    Sig: Take 2 tablets (40 mg total) by mouth daily.    Dispense:  10 tablet    Refill:  0   fluconazole  (DIFLUCAN ) 150 MG tablet    Sig: Take 1 tablet (150 mg total) by mouth every three (3) days as needed.    Dispense:  2 tablet    Refill:  0    I discussed the assessment and treatment plan with the patient. The patient was provided an opportunity to ask questions and all were answered. The patient agreed with the plan and demonstrated an understanding of the instructions.   The patient was advised to call back or seek an in-person evaluation if the symptoms worsen or if the condition fails to improve  as anticipated.  Return if symptoms worsen or fail to improve.  Jenkins CHRISTELLA Carrel, MD [facility] [phone] (phone) [phone] (fax)  [facility]     [1] No Known Allergies  "

## 2024-03-05 ENCOUNTER — Encounter: Payer: Self-pay | Admitting: Family Medicine

## 2024-03-08 ENCOUNTER — Other Ambulatory Visit (HOSPITAL_COMMUNITY)
Admission: RE | Admit: 2024-03-08 | Discharge: 2024-03-08 | Disposition: A | Source: Ambulatory Visit | Attending: Obstetrics & Gynecology | Admitting: Obstetrics & Gynecology

## 2024-03-08 ENCOUNTER — Ambulatory Visit (INDEPENDENT_AMBULATORY_CARE_PROVIDER_SITE_OTHER)

## 2024-03-08 ENCOUNTER — Encounter (HOSPITAL_BASED_OUTPATIENT_CLINIC_OR_DEPARTMENT_OTHER): Payer: Self-pay | Admitting: Obstetrics & Gynecology

## 2024-03-08 ENCOUNTER — Ambulatory Visit: Admitting: Physician Assistant

## 2024-03-08 ENCOUNTER — Encounter (HOSPITAL_BASED_OUTPATIENT_CLINIC_OR_DEPARTMENT_OTHER): Payer: Self-pay

## 2024-03-08 VITALS — BP 124/94 | HR 100 | Wt 160.4 lb

## 2024-03-08 DIAGNOSIS — R399 Unspecified symptoms and signs involving the genitourinary system: Secondary | ICD-10-CM

## 2024-03-08 DIAGNOSIS — N898 Other specified noninflammatory disorders of vagina: Secondary | ICD-10-CM | POA: Diagnosis present

## 2024-03-08 LAB — POCT URINALYSIS DIP (CLINITEK)
Bilirubin, UA: NEGATIVE
Glucose, UA: NEGATIVE mg/dL
Ketones, POC UA: NEGATIVE mg/dL
Leukocytes, UA: NEGATIVE
Nitrite, UA: NEGATIVE
POC PROTEIN,UA: NEGATIVE
Spec Grav, UA: 1.02
Urobilinogen, UA: 0.2 U/dL
pH, UA: 7

## 2024-03-08 MED ORDER — NITROFURANTOIN MONOHYD MACRO 100 MG PO CAPS: 100.0000 mg | ORAL_CAPSULE | Freq: Two times a day (BID) | ORAL | 0 refills | Status: DC

## 2024-03-08 NOTE — Progress Notes (Signed)
 NURSE VISIT- VAGINITIS/STD/POC  SUBJECTIVE:  Erika Frazier is a 44 y.o. G0P0000 GYN patient female here for a vaginal swab for vaginitis screening, STD screen and UTI symptoms.  She reports the following symptoms: local irritation, urinary frequency pressure for 1 week. Denies abnormal vaginal bleeding, significant pelvic pain, fever.  OBJECTIVE:  BP (!) 124/94 (BP Location: Right Arm, Patient Position: Sitting, Cuff Size: Normal)   Pulse 100   Wt 160 lb 6.4 oz (72.8 kg)   LMP 02/13/2024 (Exact Date)   SpO2 100%   BMI 29.34 kg/m   Appears well, in no apparent distress  Results for orders placed or performed in visit on 03/08/24 (from the past 24 hours)  POCT URINALYSIS DIP (CLINITEK)   Collection Time: 03/08/24 11:58 AM  Result Value Ref Range   Color, UA yellow yellow   Clarity, UA clear clear   Glucose, UA negative negative mg/dL   Bilirubin, UA negative negative   Ketones, POC UA negative negative mg/dL   Spec Grav, UA 8.979 8.989 - 1.025   Blood, UA small (A) negative   pH, UA 7.0 5.0 - 8.0   POC PROTEIN,UA negative negative, trace   Urobilinogen, UA 0.2 0.2 or 1.0 E.U./dL   Nitrite, UA Negative Negative   Leukocytes, UA Negative Negative    ASSESSMENT: Vaginal swab for vaginitis and STD screening GYN patient with UTI symptoms and negative nitrites  PLAN: Self-collected vaginal probe for Gonorrhea, Chlamydia, Trichomonas, Bacterial Vaginosis, Yeast sent to lab Treatment: to be determined once results are received Urine culture sent Rx sent today for UTI symptoms: Yes Follow-up as needed if symptoms persist/worsen, or new symptoms develop Follow-up: as needed

## 2024-03-10 LAB — URINE CULTURE: Organism ID, Bacteria: NO GROWTH

## 2024-03-11 ENCOUNTER — Ambulatory Visit (HOSPITAL_BASED_OUTPATIENT_CLINIC_OR_DEPARTMENT_OTHER): Payer: Self-pay | Admitting: Obstetrics & Gynecology

## 2024-03-11 LAB — CERVICOVAGINAL ANCILLARY ONLY
Bacterial Vaginitis (gardnerella): NEGATIVE
Candida Glabrata: POSITIVE — AB
Candida Vaginitis: NEGATIVE
Chlamydia: NEGATIVE
Comment: NEGATIVE
Comment: NEGATIVE
Comment: NEGATIVE
Comment: NEGATIVE
Comment: NEGATIVE
Comment: NORMAL
Neisseria Gonorrhea: NEGATIVE
Trichomonas: NEGATIVE

## 2024-03-21 NOTE — Progress Notes (Unsigned)
 "  HPI :  44 year old female here for follow-up visit for GERD.  Recall she has had reflux for some time.  She had upper endoscopy showing Hill grade 3 valve, small hiatal hernia, confirmed reflux as the cause of her symptoms with Bravo study.  She was evaluated for TIF but ultimately underwent repair of hiatal hernia with Nissen fundoplication per Dr. Tanda in December 2024.  She states postoperatively her reflux has been doing better in general, she does not have the pyrosis etc. that she had in the past.  She has had however some discomfort in her epigastric area, and aching/sporadic feeling that can bother her at times.  She has tried to change her diet and avoid any particular food triggers.  She does have some dysphagia, she has had an UES stenosis in the past that has been dilated with reliable relief.  She has been taking omeprazole  as needed, she is not sure if it helps too much.  She does not take it regularly.  She has gained 20 pounds since her surgery.  Of note she has had a chronic microcytic anemia over years.  She does not have any heavy menses.  She endorses history of IDA and taking iron every other day or so.  Denies any blood in the stools.  She did have a colonoscopy in June 2022 for iron deficiency anemia which was negative.   GERD evaluation: -Last EGD: 08/2022 -Barium esophagram: 11/23/2021: Normal -Esophageal Manometry: -pH/Impedance: None -Bravo: 08/2022: (off PPI): Significant reflux with DeMeester score 24.4, pH <4 in 7.2% of the time   Endoscopic History: - EGD 01/07/20: Dr. Murriel Normal esophagus, biopsied (no EOE). Empiric dilation to 17mm performed. Normal stomach. Duodenum normal. Biopsies taken. - EGD 10/18/2021: Hill grade 3 valve, normal esophagus.  Empiric esophageal dilation with 17 mm Savary with mucosal disruption at UES.  Normal stomach.  3 mm benign duodenal polyp, otherwise normal duodenum - EGD 09/28/2022: 1 cm hiatal hernia, Hill grade 3 valve,  esophageal dilation with 17 mm Savary with appropriate mucosal disruption at UES consistent with subtle stricture.  Normal stomach and duodenum.  Bravo placed in distal esophagus     Past Medical History:  Diagnosis Date   Allergy    Anxiety    COVID-19 04/27/2021   Depression    Fibroids, subserous    GERD (gastroesophageal reflux disease)    History of ovarian cyst    Hypertension    IDA (iron deficiency anemia)    Microcytic anemia    Mild persistent asthma    Wears glasses      Past Surgical History:  Procedure Laterality Date   COLONOSCOPY  08/20/2020   @ [facility]   ESOPHAGOGASTRODUODENOSCOPY  01/07/2020   @ [facility]   HERNIA REPAIR     HIATAL HERNIA REPAIR N/A 02/10/2023   Procedure: LAPAROSCOPIC REPAIR OF SLIDING HIATAL HERNIA REPAIR WITH A NISSEN FUNDOPLICATION;  Surgeon: Tanda Locus, MD;  Location: [facility];  Service: General;  Laterality: N/A;   HYSTEROSCOPY N/A 10/13/2020   Procedure: HYSTEROSCOPY, DILATION AND CURETTAGE;  Surgeon: Cleotilde Ronal RAMAN, MD;  Location: [facility];  Service: Gynecology;  Laterality: N/A;   Family History  Problem Relation Age of Onset   Hypertension Mother    Alcohol abuse Father        now in remission   Lupus Maternal Aunt    Heart disease Maternal Aunt    Depression Maternal Aunt    Colon cancer Maternal  Great-grandmother    Breast cancer Neg Hx    Rectal cancer Neg Hx    Stomach cancer Neg Hx    Esophageal cancer Neg Hx    Social History[1] Current Outpatient Medications  Medication Sig Dispense Refill   albuterol  (PROVENTIL ) (2.5 MG/3ML) 0.083% nebulizer solution Take 3 mLs (2.5 mg total) by nebulization every 6 (six) hours as needed for wheezing or shortness of breath. 150 mL 1   albuterol  (VENTOLIN  HFA) 108 (90 Base) MCG/ACT inhaler Inhale 2 puffs into the lungs every 6 (six) hours as needed. 18 g 3   azelastine  (ASTELIN ) 0.1 % nasal spray Place 1 spray into both nostrils 2 (two)  times daily. Use in each nostril as directed 30 mL 12   beclomethasone (QVAR  REDIHALER) 40 MCG/ACT inhaler Inhale 1 puff into the lungs 2 (two) times daily. 1 each 1   Calcium Carb-Cholecalciferol (CALCIUM + D3 PO) Take by mouth every other day. PER PT TAKES TWO TABLESPOONS QOD     ferrous sulfate (FER-IN-SOL) 75 (15 Fe) MG/ML SOLN Take by mouth every other day. PER PT ONE OUNCE QOD     Maca Root 500 MG CAPS Take 500 mg by mouth every other day.     nitrofurantoin , macrocrystal-monohydrate, (MACROBID ) 100 MG capsule Take 1 capsule (100 mg total) by mouth 2 (two) times daily. 10 capsule 0   omeprazole  (PRILOSEC) 40 MG capsule Take 1 capsule (40 mg total) by mouth 2 (two) times daily. (Patient taking differently: Take 40 mg by mouth daily as needed.) 180 capsule 1   traZODone  (DESYREL ) 50 MG tablet Take 0.5-1 tablets (25-50 mg total) by mouth at bedtime as needed for sleep. 30 tablet 3   No current facility-administered medications for this visit.   Allergies[2]   Review of Systems: All systems reviewed and negative except where noted in HPI.   Physical Exam: BP 128/84   Pulse 88   Ht 5' 2.5 (1.588 m)   Wt 165 lb 8 oz (75.1 kg)   LMP 02/13/2024 (Exact Date)   BMI 29.79 kg/m  Constitutional: Pleasant,well-developed, female in no acute distress. Neurological: Alert and oriented to person place and time. Psychiatric: Normal mood and affect. Behavior is normal.   ASSESSMENT: 44 y.o. female here for assessment of the following  1. History of Nissen fundoplication   2. History of repair of hiatal hernia   3. Gastroesophageal reflux disease, unspecified whether esophagitis present   4. Iron deficiency anemia, unspecified iron deficiency anemia type    History of GERD status post Nissen fundoplication and repair of hiatal hernia at the end of 2024.  Her typical reflux symptoms have been pretty well-controlled since her surgery but she has been having some epigastric discomfort  periodically that is sporadic.  Using omeprazole  periodically but does not seem to help too much when she uses it.  She also has a history of reported IDA with negative colonoscopy, on iron.  Discussed options with her.  I think an EGD is reasonable given her recurrent symptoms, assess integrity of fundoplication, assess for recurrent hiatal hernia, ensure no erosive changes.  We discussed risk benefits of EGD and anesthesia and she wants to proceed.  This will also evaluate her anemia.  Will recheck her CBC and iron studies on therapy to clarify where she is.  She has had some ongoing microcytosis with normal iron levels in the past, wonder if she has had some underlying thalassemia?  Will await her repeat labs.  In the interim, we  will treat her for some dyspepsia with FDgard, samples provided.  She wanted to hold off on PPI for now and [place] E[place] to assess for any erosive changes etc. we had an opening for EGD next week to expedite her evaluation.  She understands and agrees with the plan  PLAN: - schedule EGD at the [facility] - lab today - CBC, TIBC / ferritin panel  - continue iron - hold off on PPI for now until we await EGD - FD gard sample to use PRN  Marcey Naval, MD [facility]    [1]  Social History Tobacco Use   Smoking status: Former    Types: Cigars   Smokeless tobacco: Never   Tobacco comments:    Doesn't smoke tobacco  Vaping Use   Vaping status: Never Used  Substance Use Topics   Alcohol use: Yes    Alcohol/week: 1.0 standard drink of alcohol    Types: 1 Glasses of wine per week    Comment: occasional   Drug use: Yes    Frequency: 2.0 times per week    Types: Marijuana    Comment: 10-08-2020  per pt smokes average 3 times per week  [2] No Known Allergies  "

## 2024-03-22 ENCOUNTER — Ambulatory Visit: Payer: Self-pay | Admitting: Gastroenterology

## 2024-03-22 ENCOUNTER — Other Ambulatory Visit

## 2024-03-22 ENCOUNTER — Ambulatory Visit: Admitting: Gastroenterology

## 2024-03-22 ENCOUNTER — Encounter: Payer: Self-pay | Admitting: Gastroenterology

## 2024-03-22 VITALS — BP 128/84 | HR 88 | Ht 62.5 in | Wt 165.5 lb

## 2024-03-22 DIAGNOSIS — Z9889 Other specified postprocedural states: Secondary | ICD-10-CM

## 2024-03-22 DIAGNOSIS — D509 Iron deficiency anemia, unspecified: Secondary | ICD-10-CM

## 2024-03-22 DIAGNOSIS — D7589 Other specified diseases of blood and blood-forming organs: Secondary | ICD-10-CM

## 2024-03-22 DIAGNOSIS — K219 Gastro-esophageal reflux disease without esophagitis: Secondary | ICD-10-CM

## 2024-03-22 DIAGNOSIS — D649 Anemia, unspecified: Secondary | ICD-10-CM

## 2024-03-22 DIAGNOSIS — Z8719 Personal history of other diseases of the digestive system: Secondary | ICD-10-CM

## 2024-03-22 LAB — IBC + FERRITIN
Ferritin: 30.5 ng/mL (ref 10.0–291.0)
Iron: 62 ug/dL (ref 42–145)
Saturation Ratios: 14.9 % — ABNORMAL LOW (ref 20.0–50.0)
TIBC: 415.8 ug/dL (ref 250.0–450.0)
Transferrin: 297 mg/dL (ref 212.0–360.0)

## 2024-03-22 LAB — CBC WITH DIFFERENTIAL/PLATELET
Basophils Absolute: 0 K/uL (ref 0.0–0.1)
Basophils Relative: 0.7 % (ref 0.0–3.0)
Eosinophils Absolute: 0.1 K/uL (ref 0.0–0.7)
Eosinophils Relative: 2.3 % (ref 0.0–5.0)
HCT: 34.8 % — ABNORMAL LOW (ref 36.0–46.0)
Hemoglobin: 11.3 g/dL — ABNORMAL LOW (ref 12.0–15.0)
Lymphocytes Relative: 36.5 % (ref 12.0–46.0)
Lymphs Abs: 1.9 K/uL (ref 0.7–4.0)
MCHC: 32.5 g/dL (ref 30.0–36.0)
MCV: 73.2 fl — ABNORMAL LOW (ref 78.0–100.0)
Monocytes Absolute: 0.4 K/uL (ref 0.1–1.0)
Monocytes Relative: 7.1 % (ref 3.0–12.0)
Neutro Abs: 2.8 K/uL (ref 1.4–7.7)
Neutrophils Relative %: 53.4 % (ref 43.0–77.0)
Platelets: 420 K/uL — ABNORMAL HIGH (ref 150.0–400.0)
RBC: 4.75 Mil/uL (ref 3.87–5.11)
RDW: 15.3 % (ref 11.5–15.5)
WBC: 5.3 K/uL (ref 4.0–10.5)

## 2024-03-22 NOTE — Patient Instructions (Signed)
 Your provider has requested that you go to the basement level for lab work before leaving today. Press B on the elevator. The lab is located at the first door on the left as you exit the elevator.   You have been scheduled for an endoscopy. Please follow written instructions given to you at your visit today.  If you use inhalers (even only as needed), please bring them with you on the day of your procedure.  If you take any of the following medications, they will need to be adjusted prior to your procedure:   DO NOT TAKE 7 DAYS PRIOR TO TEST- Trulicity (dulaglutide) Ozempic, Wegovy (semaglutide) Mounjaro, Zepbound (tirzepatide) Bydureon Bcise (exanatide extended release)  DO NOT TAKE 1 DAY PRIOR TO YOUR TEST Rybelsus (semaglutide) Adlyxin (lixisenatide) Victoza (liraglutide) Byetta (exanatide) ___________________________________________________________________________  _______________________________________________________  If your blood pressure at your visit was 140/90 or greater, please contact your primary care physician to follow up on this.  _______________________________________________________  If you are age 19 or older, your body mass index should be between 23-30. Your Body mass index is 29.79 kg/m. If this is out of the aforementioned range listed, please consider follow up with your Primary Care Provider.  If you are age 12 or younger, your body mass index should be between 19-25. Your Body mass index is 29.79 kg/m. If this is out of the aformentioned range listed, please consider follow up with your Primary Care Provider.   ________________________________________________________  The [facility] providers would like to encourage you to use MYCHART to communicate with providers for non-urgent requests or questions.  Due to long hold times on the telephone, sending your provider a message by [place] may be a faster and more efficient way to get a response.  Please  allow 48 business hours for a response.  Please remember that this is for non-urgent requests.  _______________________________________________________  [facility] is using a team-based approach to care.  Your team is made up of your doctor and two to three APPS. Our APPS (Nurse Practitioners and Physician Assistants) work with your physician to ensure care continuity for you. They are fully qualified to address your health concerns and develop a treatment plan. They communicate directly with your gastroenterologist to care for you. Seeing the Advanced Practice Practitioners on your physician's team can help you by facilitating care more promptly, often allowing for earlier appointments, access to diagnostic testing, procedures, and other specialty referrals.

## 2024-03-23 ENCOUNTER — Other Ambulatory Visit: Payer: Self-pay | Admitting: Medical Genetics

## 2024-03-27 ENCOUNTER — Encounter: Payer: Self-pay | Admitting: Gastroenterology

## 2024-03-27 ENCOUNTER — Ambulatory Visit: Admitting: Gastroenterology

## 2024-03-27 VITALS — BP 117/72 | HR 77 | Temp 98.1°F | Resp 18 | Ht 62.0 in | Wt 165.0 lb

## 2024-03-27 DIAGNOSIS — K219 Gastro-esophageal reflux disease without esophagitis: Secondary | ICD-10-CM

## 2024-03-27 DIAGNOSIS — Z8719 Personal history of other diseases of the digestive system: Secondary | ICD-10-CM

## 2024-03-27 DIAGNOSIS — K2950 Unspecified chronic gastritis without bleeding: Secondary | ICD-10-CM | POA: Diagnosis present

## 2024-03-27 LAB — HGB FRACTIONATION CASCADE
Hgb A2: 2.3 % (ref 1.8–3.2)
Hgb A: 97.7 % (ref 96.4–98.8)
Hgb F: 0 % (ref 0.0–2.0)
Hgb S: 0 %

## 2024-03-27 MED ORDER — SODIUM CHLORIDE 0.9 % IV SOLN: 500.0000 mL | INTRAVENOUS | Status: DC

## 2024-03-27 NOTE — Progress Notes (Signed)
 Report given to PACU, vss

## 2024-03-27 NOTE — Op Note (Signed)
 [facility] Patient Name: Erika Frazier Procedure Date: 03/27/2024 1:27 PM MRN: [id number] Endoscopist: Elspeth P. Leigh , MD, [id number] Age: 44 Referring MD:  Date of Birth: 07/15/1980 Gender: Female Account #: [id number] Procedure:                Upper GI endoscopy Indications:              history of GERD, Assessment following Nissen                            fundoplication 12/24 - some recurrent epigastric                            pain Medicines:                Monitored Anesthesia Care Procedure:                Pre-Anesthesia Assessment:                           - Prior to the procedure, a History and Physical                            was performed, and patient medications and                            allergies were reviewed. The patient's tolerance of                            previous anesthesia was also reviewed. The risks                            and benefits of the procedure and the sedation                            options and risks were discussed with the patient.                            All questions were answered, and informed consent                            was obtained. Prior Anticoagulants: The patient has                            taken no anticoagulant or antiplatelet agents. ASA                            Grade Assessment: II - A patient with mild systemic                            disease. After reviewing the risks and benefits,                            the patient was deemed in satisfactory condition to  undergo the procedure.                           After obtaining informed consent, the endoscope was                            passed under direct vision. Throughout the                            procedure, the patient's blood pressure, pulse, and                            oxygen saturations were monitored continuously. The                            GIF [id number] #[id number] was introduced through the                             mouth, and advanced to the second part of duodenum.                            The upper GI endoscopy was accomplished without                            difficulty. The patient tolerated the procedure                            well. Scope In: Scope Out: Findings:                 Esophagogastric landmarks were identified: the                            Z-line was found at 39 cm, the gastroesophageal                            junction was found at 39 cm and the upper extent of                            the gastric folds was found at 39 cm from the                            incisors.                           The exam of the esophagus was otherwise normal. No                            erosive changes. No obvious hiatal hernia.                           Evidence of a Nissen fundoplication was found in                            the cardia. The wrap appeared  intact.                           The exam of the stomach was otherwise normal.                           Biopsies were taken with a cold forceps for                            Helicobacter pylori testing.                           The examined duodenum was normal. Complications:            No immediate complications. Estimated blood loss:                            Minimal. Estimated Blood Loss:     Estimated blood loss was minimal. Impression:               - Esophagogastric landmarks identified.                           - Normal esophagus otherwise.                           - A Nissen fundoplication was found. The wrap                            appears intact.                           - Normal stomach otherwise. Biopsies taken to rule                            out H pylori                           - Normal examined duodenum. Recommendation:           - Patient has a contact number available for                            emergencies. The signs and symptoms of potential                             delayed complications were discussed with the                            patient. Return to normal activities tomorrow.                            Written discharge instructions were provided to the                            patient.                           -  Resume previous diet.                           - Continue present medications.                           - Await pathology results.                           - Trial of FD gard PRN (as per clinic)                           - If symptoms persist, trial of omeprazole  40mg  /                            day every day for 30 day trial                           - Consideration for RUQ US  to evaluate the                            gallbladder if symptoms persist Khaya Theissen P. Shanya Ferriss, MD 03/27/2024 1:52:17 PM This report has been signed electronically.

## 2024-03-27 NOTE — Progress Notes (Signed)
 1338 BP 162/121, Labetalol given IV, MD update, vss

## 2024-03-27 NOTE — Progress Notes (Signed)
 History and Physical Interval Note: HEre for EGD to evaluate GERD, epigastric symptoms. HIstory of Nissen 12/24 with HH Repair. Now back on intermittent omeprazole  use, has held prior to this exam. Given FD gard to use PRN. NO interval changes since office visit. She wishes to proceed.     03/27/2024 1:30 PM  Erika Frazier  has presented today for endoscopic procedure(s), with the diagnosis of  Encounter Diagnoses  Name Primary?   History of repair of hiatal hernia Yes   Gastroesophageal reflux disease, unspecified whether esophagitis present   .  The various methods of evaluation and treatment have been discussed with the patient and/or family. After consideration of risks, benefits and other options for treatment, the patient has consented to  the endoscopic procedure(s).   The patient's history has been reviewed, patient examined, no change in status, stable for surgery.  I have reviewed the patient's chart and labs. The patient was provided an opportunity to ask questions and all were answered. The patient agreed with the plan.    Marcey Naval, MD [facility]

## 2024-03-27 NOTE — Progress Notes (Signed)
1323 Robinul 0.1 mg IV given due large amount of secretions upon assessment.  MD made aware, vss 

## 2024-03-27 NOTE — Progress Notes (Signed)
 Called to room to assist during endoscopic procedure.  Patient ID and intended procedure confirmed with present staff. Received instructions for my participation in the procedure from the performing physician.

## 2024-03-27 NOTE — Patient Instructions (Signed)
 YOU HAD AN ENDOSCOPIC PROCEDURE TODAY AT THE [facility]:   Refer to the procedure report that was given to you for any specific questions about what was found during the examination.  If the procedure report does not answer your questions, please call your gastroenterologist to clarify.  If you requested that your care partner not be given the details of your procedure findings, then the procedure report has been included in a sealed envelope for you to review at your convenience later.  YOU SHOULD EXPECT: Some feelings of bloating in the abdomen. Passage of more gas than usual.  Walking can help get rid of the air that was put into your GI tract during the procedure and reduce the bloating. If you had a lower endoscopy (such as a colonoscopy or flexible sigmoidoscopy) you may notice spotting of blood in your stool or on the toilet paper. If you underwent a bowel prep for your procedure, you may not have a normal bowel movement for a few days.  Please Note:  You might notice some irritation and congestion in your nose or some drainage.  This is from the oxygen used during your procedure.  There is no need for concern and it should clear up in a day or so.  SYMPTOMS TO REPORT IMMEDIATELY:  Following upper endoscopy (EGD)  Vomiting of blood or coffee ground material  New chest pain or pain under the shoulder blades  Painful or persistently difficult swallowing  New shortness of breath  Fever of 100F or higher  Black, tarry-looking stools  Resume previous diet Continue present medications Await pathology results  For urgent or emergent issues, a gastroenterologist can be reached at any hour by calling [phone]. Do not use MyChart messaging for urgent concerns.    DIET:  We do recommend a small meal at first, but then you may proceed to your regular diet.  Drink plenty of fluids but you should avoid alcoholic beverages for 24 hours.  ACTIVITY:  You should plan to take it  easy for the rest of today and you should NOT DRIVE or use heavy machinery until tomorrow (because of the sedation medicines used during the test).    FOLLOW UP: Our staff will call the number listed on your records the next business day following your procedure.  We will call around 7:15- 8:00 am to check on you and address any questions or concerns that you may have regarding the information given to you following your procedure. If we do not reach you, we will leave a message.     If any biopsies were taken you will be contacted by phone or by letter within the next 1-3 weeks.  Please call us  at [phone] if you have not heard about the biopsies in 3 weeks.    SIGNATURES/CONFIDENTIALITY: You and/or your care partner have signed paperwork which will be entered into your electronic medical record.  These signatures attest to the fact that that the information above on your After Visit Summary has been reviewed and is understood.  Full responsibility of the confidentiality of this discharge information lies with you and/or your care-partner.

## 2024-03-27 NOTE — Progress Notes (Signed)
 Pt's states no medical or surgical changes since previsit or office visit.

## 2024-03-28 ENCOUNTER — Telehealth: Payer: Self-pay

## 2024-03-28 NOTE — Telephone Encounter (Signed)
" °  Follow up Call-     03/27/2024   12:49 PM 09/28/2022   12:56 PM 10/18/2021    2:10 PM  Call back number  Post procedure Call Back phone  # [phone]  Permission to leave phone message Yes Yes Yes     Left message   "

## 2024-04-01 LAB — SURGICAL PATHOLOGY

## 2024-04-22 ENCOUNTER — Inpatient Hospital Stay

## 2024-04-22 ENCOUNTER — Other Ambulatory Visit
